# Patient Record
Sex: Male | Born: 1945
Health system: Southern US, Community
[De-identification: ages and names within clinical notes are randomized; demographics above are authoritative.]

## PROBLEM LIST (undated history)

## (undated) DIAGNOSIS — E785 Hyperlipidemia, unspecified: Secondary | ICD-10-CM

## (undated) DIAGNOSIS — R413 Other amnesia: Secondary | ICD-10-CM

## (undated) DIAGNOSIS — R7303 Prediabetes: Secondary | ICD-10-CM

## (undated) DIAGNOSIS — T7840XA Allergy, unspecified, initial encounter: Secondary | ICD-10-CM

## (undated) HISTORY — DX: Hyperlipidemia, unspecified: E78.5

## (undated) HISTORY — PX: FINGER SURGERY: SHX640

## (undated) HISTORY — DX: Prediabetes: R73.03

## (undated) HISTORY — DX: Other amnesia: R41.3

## (undated) HISTORY — DX: Allergy, unspecified, initial encounter: T78.40XA

---

## 2015-06-21 ENCOUNTER — Telehealth: Payer: Self-pay

## 2015-06-21 ENCOUNTER — Ambulatory Visit (INDEPENDENT_AMBULATORY_CARE_PROVIDER_SITE_OTHER): Payer: Medicare Other | Admitting: Internal Medicine

## 2015-06-21 VITALS — BP 120/74 | HR 60 | Temp 97.7°F | Resp 18 | Ht 72.0 in | Wt 196.0 lb

## 2015-06-21 DIAGNOSIS — L299 Pruritus, unspecified: Secondary | ICD-10-CM | POA: Diagnosis not present

## 2015-06-21 MED ORDER — CETIRIZINE HCL 10 MG PO TABS: 10.0000 mg | ORAL_TABLET | Freq: Every day | ORAL | Status: DC

## 2015-06-21 MED ORDER — TRIAMCINOLONE 0.1 % CREAM:EUCERIN CREAM 1:1: 1.0000 "application " | TOPICAL_CREAM | Freq: Three times a day (TID) | CUTANEOUS | Status: DC

## 2015-06-21 NOTE — Progress Notes (Signed)
   Subjective:  This chart was scribed for [name], MD by Eric Tyler, Medical Scribe. This patient was seen in Room 3 and the patient's care was started at 10:15 AM.   Patient ID: Eric Tyler, male    DOB: 03-26-1946, 69 y.o.   MRN: [id number]  HPI HPI Comments: Eric Tyler is a 69 y.o. male who presents to [facility] complaining of itching over the back and neck area, gradual onset one day ago. He has noted no skin lesions. Pt notes that the itching is more severe over his neck area. He reports associated symptoms of dryness and a heat sensation to the area. Pt states that it is affecting his sleep. Pt reports that he did not start any new food, or change in skin products; he did mow the grass while wearing a long sleeve shirt yesterday morning. Pt reports that he took Benadryl for the symptoms; however he found no relief with it. Pt denies any symptoms of discomfort, hives, diarrhea, nausea, shortness of breath, rhinorrhea, sore throat.     He reports good health with no chronic illnesses and no medications  Review of Systems  HENT: Negative for rhinorrhea.   Respiratory: Negative for shortness of breath.   Gastrointestinal: Negative for nausea and diarrhea.  Skin: Negative for color change and rash.       itching      Objective:   Physical Exam  Constitutional: He is oriented to person, place, and time. He appears well-developed and well-nourished. No distress.  HENT:  Head: Normocephalic and atraumatic.  Right Ear: External ear normal.  Left Ear: External ear normal.  Nose: Nose normal.  Mouth/Throat: Oropharynx is clear and moist.  Eyes: Conjunctivae and EOM are normal. Pupils are equal, round, and reactive to light.  Neck: Neck supple. No thyromegaly present.  Cardiovascular: Normal rate.   Pulmonary/Chest: Effort normal.  Lymphadenopathy:    He has no cervical adenopathy.  Neurological: He is alert and oriented to person, place, and  time. No cranial nerve deficit.  Skin: Skin is warm and dry.  He has no skin changes in the areas where he is itching. He has dry skin in general but not to the point of roughness or ichthyosis.  Psychiatric: He has a normal mood and affect. His behavior is normal.  Nursing note and vitals reviewed.     Assessment & Plan:  I have completed the patient encounter in its entirety as documented by the scribe, with editing by me where necessary. Robert P. Laney Pastor, M.D.  Pruritis of unknown etiology Meds ordered this encounter  Medications  . Triamcinolone Acetonide (TRIAMCINOLONE 0.1 % CREAM : EUCERIN) CREA    Sig: Apply 1 application topically 3 (three) times daily. 50:50 mix---apply 5-7 days tid    Dispense:  1 each    Refill:  0  . cetirizine (ZYRTEC) 10 MG tablet    Sig: Take 1 tablet (10 mg total) by mouth daily.    Dispense:  7 tablet    Refill:  0    May prefer otc if less expensive   If he develops progression of this itching then referred underlying pathology will be initiated

## 2015-06-21 NOTE — Telephone Encounter (Signed)
Called in Triamcinolone acetonide cream pre request of Dr. Laney Pastor because his prescription printed instead of going to the pharmacy. The patient left before we noticed that it was printed. It was called in to [facility] on [address]. Tried to reach out to patient about prescription but was unable to get through on both numbers that were listed.

## 2015-06-22 ENCOUNTER — Encounter: Payer: Self-pay | Admitting: Internal Medicine

## 2015-07-29 DIAGNOSIS — Z0000 Encounter for general adult medical examination without abnormal findings: Secondary | ICD-10-CM | POA: Diagnosis not present

## 2015-07-29 DIAGNOSIS — Z23 Encounter for immunization: Secondary | ICD-10-CM | POA: Diagnosis not present

## 2015-07-29 DIAGNOSIS — Z1212 Encounter for screening for malignant neoplasm of rectum: Secondary | ICD-10-CM | POA: Diagnosis not present

## 2015-09-02 DIAGNOSIS — Z0000 Encounter for general adult medical examination without abnormal findings: Secondary | ICD-10-CM | POA: Diagnosis not present

## 2015-09-02 DIAGNOSIS — Z0001 Encounter for general adult medical examination with abnormal findings: Secondary | ICD-10-CM | POA: Diagnosis not present

## 2015-09-02 DIAGNOSIS — Z13220 Encounter for screening for lipoid disorders: Secondary | ICD-10-CM | POA: Diagnosis not present

## 2015-09-02 DIAGNOSIS — Z125 Encounter for screening for malignant neoplasm of prostate: Secondary | ICD-10-CM | POA: Diagnosis not present

## 2015-09-02 DIAGNOSIS — L608 Other nail disorders: Secondary | ICD-10-CM | POA: Diagnosis not present

## 2015-10-18 ENCOUNTER — Emergency Department (HOSPITAL_COMMUNITY): Payer: Medicare Other

## 2015-10-18 ENCOUNTER — Emergency Department (HOSPITAL_COMMUNITY)
Admission: EM | Admit: 2015-10-18 | Discharge: 2015-10-18 | Disposition: A | Discharge status: Home / Self Care | Payer: Medicare Other | Attending: Emergency Medicine | Admitting: Emergency Medicine

## 2015-10-18 ENCOUNTER — Encounter (HOSPITAL_COMMUNITY): Payer: Self-pay

## 2015-10-18 DIAGNOSIS — Z79899 Other long term (current) drug therapy: Secondary | ICD-10-CM | POA: Diagnosis not present

## 2015-10-18 DIAGNOSIS — R109 Unspecified abdominal pain: Secondary | ICD-10-CM | POA: Diagnosis not present

## 2015-10-18 DIAGNOSIS — N201 Calculus of ureter: Secondary | ICD-10-CM

## 2015-10-18 DIAGNOSIS — R197 Diarrhea, unspecified: Secondary | ICD-10-CM | POA: Diagnosis not present

## 2015-10-18 DIAGNOSIS — R1110 Vomiting, unspecified: Secondary | ICD-10-CM | POA: Diagnosis not present

## 2015-10-18 LAB — COMPREHENSIVE METABOLIC PANEL
ALK PHOS: 55 U/L (ref 38–126)
ALT: 28 U/L (ref 17–63)
ANION GAP: 9 (ref 5–15)
AST: 30 U/L (ref 15–41)
Albumin: 4.4 g/dL (ref 3.5–5.0)
BILIRUBIN TOTAL: 0.9 mg/dL (ref 0.3–1.2)
BUN: 14 mg/dL (ref 6–20)
CALCIUM: 9.6 mg/dL (ref 8.9–10.3)
CO2: 27 mmol/L (ref 22–32)
Chloride: 105 mmol/L (ref 101–111)
Creatinine, Ser: 1.19 mg/dL (ref 0.61–1.24)
GFR calc Af Amer: >60 mL/min (ref >60)
GLUCOSE: 123 mg/dL — AB (ref 65–99)
POTASSIUM: 4.2 mmol/L (ref 3.5–5.1)
Sodium: 141 mmol/L (ref 135–145)
TOTAL PROTEIN: 7.5 g/dL (ref 6.5–8.1)

## 2015-10-18 LAB — URINALYSIS, ROUTINE W REFLEX MICROSCOPIC
BILIRUBIN URINE: NEGATIVE
Glucose, UA: NEGATIVE mg/dL
KETONES UR: NEGATIVE mg/dL
LEUKOCYTES UA: NEGATIVE
NITRITE: NEGATIVE
PH: 6 (ref 5.0–8.0)
PROTEIN: NEGATIVE mg/dL
Specific Gravity, Urine: 1.013 (ref 1.005–1.030)

## 2015-10-18 LAB — CBC
HEMATOCRIT: 42.8 % (ref 39.0–52.0)
HEMOGLOBIN: 14 g/dL (ref 13.0–17.0)
MCH: 31.3 pg (ref 26.0–34.0)
MCHC: 32.7 g/dL (ref 30.0–36.0)
MCV: 95.5 fL (ref 78.0–100.0)
Platelets: 288 10*3/uL (ref 150–400)
RBC: 4.48 MIL/uL (ref 4.22–5.81)
RDW: 13.1 % (ref 11.5–15.5)
WBC: 3.1 10*3/uL — AB (ref 4.0–10.5)

## 2015-10-18 LAB — URINE MICROSCOPIC-ADD ON

## 2015-10-18 LAB — LIPASE, BLOOD: Lipase: 30 U/L (ref 11–51)

## 2015-10-18 MED ORDER — SODIUM CHLORIDE 0.9 % IV SOLN
Freq: Once | INTRAVENOUS | Status: AC
  Administered 2015-10-18: 07:00:00 via INTRAVENOUS

## 2015-10-18 MED ORDER — ONDANSETRON HCL 4 MG/2ML IJ SOLN
4.0000 mg | Freq: Once | INTRAMUSCULAR | Status: AC | PRN
  Administered 2015-10-18: 4 mg via INTRAVENOUS
  Filled 2015-10-18 (×2): qty 2

## 2015-10-18 MED ORDER — ONDANSETRON 8 MG PO TBDP: 8.0000 mg | ORAL_TABLET | Freq: Three times a day (TID) | ORAL | Status: DC | PRN

## 2015-10-18 MED ORDER — IOHEXOL 300 MG/ML  SOLN
100.0000 mL | Freq: Once | INTRAMUSCULAR | Status: AC | PRN
  Administered 2015-10-18: 100 mL via INTRAVENOUS

## 2015-10-18 MED ORDER — IOHEXOL 300 MG/ML  SOLN
50.0000 mL | Freq: Once | INTRAMUSCULAR | Status: AC | PRN
  Administered 2015-10-18: 50 mL via ORAL

## 2015-10-18 MED ORDER — HYDROMORPHONE HCL 1 MG/ML IJ SOLN
1.0000 mg | INTRAMUSCULAR | Status: DC | PRN
  Administered 2015-10-18: 1 mg via INTRAVENOUS
  Filled 2015-10-18: qty 1

## 2015-10-18 MED ORDER — ONDANSETRON HCL 4 MG/2ML IJ SOLN
4.0000 mg | Freq: Once | INTRAMUSCULAR | Status: AC
  Administered 2015-10-18: 4 mg via INTRAVENOUS
  Filled 2015-10-18: qty 2

## 2015-10-18 MED ORDER — HYDROCODONE-ACETAMINOPHEN 5-325 MG PO TABS: 1.0000 | ORAL_TABLET | ORAL | Status: DC | PRN

## 2015-10-18 MED ORDER — SODIUM CHLORIDE 0.9 % IV SOLN
1000.0000 mL | INTRAVENOUS | Status: DC
  Administered 2015-10-18: 1000 mL via INTRAVENOUS

## 2015-10-18 NOTE — ED Notes (Signed)
Pt started vomiting about 5am, no diarrhea

## 2015-10-18 NOTE — ED Notes (Signed)
Pt given a urinal and informed he needs to give a urine sample

## 2015-10-18 NOTE — ED Provider Notes (Signed)
CSN: [id number]     Arrival date & time 10/18/15  0547 History   First MD Initiated Contact with Patient 10/18/15 [phone]     Chief Complaint  Patient presents with  . Emesis    Patient is a 69 y.o. male presenting with vomiting. The history is provided by the patient.  Emesis Duration:  2 hours Timing:  Constant Number of daily episodes:  20 Quality:  Undigested food Chronicity:  New Relieved by:  Nothing Associated symptoms: abdominal pain   Associated symptoms: no diarrhea and no fever   Abdominal pain:    Pain location: mid right abdomen towards the side.   Quality:  Sharp   Severity:  Severe   Onset quality:  Sudden   Duration:  2 hours   Timing:  Intermittent   Chronicity:  New Risk factors: no prior abdominal surgery, no suspect food intake and no travel to endemic areas     Past Medical History  Diagnosis Date  . Allergy    Past Surgical History  Procedure Laterality Date  . Finger surgery     Family History  Problem Relation Age of Onset  . Hypertension Mother   . Hypertension Father   . Hyperlipidemia Father    Social History  Substance Use Topics  . Smoking status: Never Smoker   . Smokeless tobacco: None  . Alcohol Use: No    Review of Systems  Gastrointestinal: Positive for vomiting and abdominal pain. Negative for diarrhea.  All other systems reviewed and are negative.     Allergies  Review of patient's allergies indicates no known allergies.  Home Medications   Prior to Admission medications   Medication Sig Start Date End Date Taking? Authorizing Provider  Multiple Vitamin (MULTIVITAMIN WITH MINERALS) TABS tablet Take 1 tablet by mouth daily.   Yes Historical Provider, MD  omega-3 acid ethyl esters (LOVAZA) 1 G capsule Take 1 g by mouth 2 (two) times daily.   Yes Historical Provider, MD  cetirizine (ZYRTEC) 10 MG tablet Take 1 tablet (10 mg total) by mouth daily. Patient not taking: Reported on 10/18/2015 06/21/15   Leandrew Koyanagi, MD   HYDROcodone-acetaminophen (NORCO/VICODIN) 5-325 MG tablet Take 1 tablet by mouth every 4 (four) hours as needed. 10/18/15   Dorie Rank, MD  ondansetron (ZOFRAN ODT) 8 MG disintegrating tablet Take 1 tablet (8 mg total) by mouth every 8 (eight) hours as needed for nausea or vomiting. 10/18/15   Dorie Rank, MD  Triamcinolone Acetonide (TRIAMCINOLONE 0.1 % CREAM : EUCERIN) CREA Apply 1 application topically 3 (three) times daily. 50:50 mix---apply 5-7 days tid Patient not taking: Reported on 10/18/2015 06/21/15   Leandrew Koyanagi, MD   BP 126/78 mmHg  Pulse 61  Temp(Src) 97.3 F (36.3 C) (Axillary)  Resp 16  SpO2 92% Physical Exam  Constitutional: He appears well-developed and well-nourished. No distress.  HENT:  Head: Normocephalic and atraumatic.  Right Ear: External ear normal.  Left Ear: External ear normal.  Eyes: Conjunctivae are normal. Right eye exhibits no discharge. Left eye exhibits no discharge. No scleral icterus.  Neck: Neck supple. No tracheal deviation present.  Cardiovascular: Normal rate, regular rhythm and intact distal pulses.   Pulmonary/Chest: Effort normal and breath sounds normal. No stridor. No respiratory distress. He has no wheezes. He has no rales.  Abdominal: Soft. Bowel sounds are normal. He exhibits no distension, no abdominal bruit, no ascites, no pulsatile midline mass and no mass. There is tenderness in the right upper quadrant  and right lower quadrant. There is no rigidity, no rebound and no guarding. No hernia.  Musculoskeletal: He exhibits no edema or tenderness.  Neurological: He is alert. He has normal strength. No cranial nerve deficit (no facial droop, extraocular movements intact, no slurred speech) or sensory deficit. He exhibits normal muscle tone. He displays no seizure activity. Coordination normal.  Skin: Skin is warm and dry. No rash noted.  Psychiatric: He has a normal mood and affect.  Nursing note and vitals reviewed.   ED Course   Procedures (including critical care time) Labs Review Labs Reviewed  COMPREHENSIVE METABOLIC PANEL - Abnormal; Notable for the following:    Glucose, Bld 123 (*)    All other components within normal limits  CBC - Abnormal; Notable for the following:    WBC 3.1 (*)    All other components within normal limits  URINALYSIS, ROUTINE W REFLEX MICROSCOPIC (NOT AT [facility]) - Abnormal; Notable for the following:    Hgb urine dipstick TRACE (*)    All other components within normal limits  URINE MICROSCOPIC-ADD ON - Abnormal; Notable for the following:    Squamous Epithelial / LPF 0-5 (*)    Bacteria, UA RARE (*)    All other components within normal limits  LIPASE, BLOOD    Imaging Review Ct Abdomen Pelvis W Contrast  10/18/2015  CLINICAL DATA:  Pt started vomiting about 5am, no diarrhea. Right-sided abdominal pain and vomiting also reported. No other HX to report EXAM: CT ABDOMEN AND PELVIS WITH CONTRAST TECHNIQUE: Multidetector CT imaging of the abdomen and pelvis was performed using the standard protocol following bolus administration of intravenous contrast. CONTRAST:  64mL OMNIPAQUE IOHEXOL 300 MG/ML SOLN, 173mL OMNIPAQUE IOHEXOL 300 MG/ML SOLN COMPARISON:  None. FINDINGS: Lung bases: Minor dependent subsegmental atelectasis. Otherwise clear. Heart mildly enlarged. Liver, spleen, gallbladder, pancreas, adrenal glands:  Normal. Kidneys, ureters, bladder: 4 mm low-density lesion arises from the posterior midpole the left kidney, most likely a cyst. No other renal masses or lesions. No renal stones. No hydronephrosis. Small calculus lies at, or just adjacent to, the right ureterovesicular junction. This may be outside of the ureter and bladder. However, given the symptoms of right-sided abdominal pain and vomiting, a ureteral stone without hydronephrosis should be considered likely. No other evidence of a ureteral stone. Bladder is unremarkable. Prostate gland:  Mildly enlarged. Lymph nodes:  No  adenopathy. Ascites:  None. Gastrointestinal: Numerous colonic diverticula mostly along the sigmoid colon. No diverticulitis. Colon otherwise unremarkable. Stomach and small bowel are unremarkable. Normal appendix is visualized. Musculoskeletal: Arthropathic changes of the hips, right greater than left. Mild degenerative changes of the lumbar spine. No osteoblastic or osteolytic lesions. IMPRESSION: 1. Possible small stone, approximate 2 mm in size, at the right ureterovesicular junction versus a directly adjacent phlebolith. No hydronephrosis. 2. No other evidence of an acute abnormality. 3. Numerous colonic diverticula without diverticulitis. 4. Normal appendix visualized. Electronically Signed   By: Lajean Manes M.D.   On: 10/18/2015 11:40   I have personally reviewed and evaluated these images and lab results as part of my medical decision-making.  Medications  0.9 %  sodium chloride infusion (1,000 mLs Intravenous New Bag/Given 10/18/15 0735)  HYDROmorphone (DILAUDID) injection 1 mg (1 mg Intravenous Given 10/18/15 0730)  ondansetron (ZOFRAN) injection 4 mg (4 mg Intravenous Given 10/18/15 0642)  0.9 %  sodium chloride infusion ( Intravenous Stopped 10/18/15 0742)  ondansetron (ZOFRAN) injection 4 mg (4 mg Intravenous Given 10/18/15 0729)  iohexol (OMNIPAQUE) 300  MG/ML solution 50 mL (50 mLs Oral Contrast Given 10/18/15 0900)  iohexol (OMNIPAQUE) 300 MG/ML solution 100 mL (100 mLs Intravenous Contrast Given 10/18/15 1115)     MDM   Final diagnoses:  Ureteral stone   I suspect the patient's symptoms are related to ureteral stone. CT scan is not definitive because there is no evidence of hydronephrosis or hydroureter however clinically his symptoms correlate with ureteral colic. Patient's laboratory tests are otherwise unremarkable. He has no other abnormality noted on the CT scan to account for his pain.  The patient's symptoms improved with treatment in the emergency department. Plan for  outpatient follow-up with primary doctor or urologist. Use a urine strainer. I will discharge home with a prescription for hydrocodone and Zofran.    Dorie Rank, MD 10/18/15 1210

## 2015-10-18 NOTE — Discharge Instructions (Signed)
Please use a urine strainer as we discussed.  Follow up with urology or primary care doctor in 1 week.   Kidney Stones Kidney stones (urolithiasis) are deposits that form inside your kidneys. The intense pain is caused by the stone moving through the urinary tract. When the stone moves, the ureter goes into spasm around the stone. The stone is usually passed in the urine.  CAUSES   A disorder that makes certain neck glands produce too much parathyroid hormone (primary hyperparathyroidism).  A buildup of uric acid crystals, similar to gout in your joints.  Narrowing (stricture) of the ureter.  A kidney obstruction present at birth (congenital obstruction).  Previous surgery on the kidney or ureters.  Numerous kidney infections. SYMPTOMS   Feeling sick to your stomach (nauseous).  Throwing up (vomiting).  Blood in the urine (hematuria).  Pain that usually spreads (radiates) to the groin.  Frequency or urgency of urination. DIAGNOSIS   Taking a history and physical exam.  Blood or urine tests.  CT scan.  Occasionally, an examination of the inside of the urinary bladder (cystoscopy) is performed. TREATMENT   Observation.  Increasing your fluid intake.  Extracorporeal shock wave lithotripsy--This is a noninvasive procedure that uses shock waves to break up kidney stones.  Surgery may be needed if you have severe pain or persistent obstruction. There are various surgical procedures. Most of the procedures are performed with the use of small instruments. Only small incisions are needed to accommodate these instruments, so recovery time is minimized. The size, location, and chemical composition are all important variables that will determine the proper choice of action for you. Talk to your health care provider to better understand your situation so that you will minimize the risk of injury to yourself and your kidney.  HOME CARE INSTRUCTIONS   Drink enough water and fluids  to keep your urine clear or pale yellow. This will help you to pass the stone or stone fragments.  Strain all urine through the provided strainer. Keep all particulate matter and stones for your health care provider to see. The stone causing the pain may be as small as a grain of salt. It is very important to use the strainer each and every time you pass your urine. The collection of your stone will allow your health care provider to analyze it and verify that a stone has actually passed. The stone analysis will often identify what you can do to reduce the incidence of recurrences.  Only take over-the-counter or prescription medicines for pain, discomfort, or fever as directed by your health care provider.  Keep all follow-up visits as told by your health care provider. This is important.  Get follow-up X-rays if required. The absence of pain does not always mean that the stone has passed. It may have only stopped moving. If the urine remains completely obstructed, it can cause loss of kidney function or even complete destruction of the kidney. It is your responsibility to make sure X-rays and follow-ups are completed. Ultrasounds of the kidney can show blockages and the status of the kidney. Ultrasounds are not associated with any radiation and can be performed easily in a matter of minutes.  Make changes to your daily diet as told by your health care provider. You may be told to:  Limit the amount of salt that you eat.  Eat 5 or more servings of fruits and vegetables each day.  Limit the amount of meat, poultry, fish, and eggs that you eat.  Collect a 24-hour urine sample as told by your health care provider.You may need to collect another urine sample every 6-12 months. SEEK MEDICAL CARE IF:  You experience pain that is progressive and unresponsive to any pain medicine you have been prescribed. SEEK IMMEDIATE MEDICAL CARE IF:   Pain cannot be controlled with the prescribed  medicine.  You have a fever or shaking chills.  The severity or intensity of pain increases over 18 hours and is not relieved by pain medicine.  You develop a new onset of abdominal pain.  You feel faint or pass out.  You are unable to urinate.   This information is not intended to replace advice given to you by your health care provider. Make sure you discuss any questions you have with your health care provider.   Document Released: 11/16/2005 Document Revised: 08/07/2015 Document Reviewed: 04/19/2013 Elsevier Interactive Patient Education [employer].

## 2015-11-07 DIAGNOSIS — N201 Calculus of ureter: Secondary | ICD-10-CM | POA: Diagnosis not present

## 2016-05-18 ENCOUNTER — Emergency Department (HOSPITAL_COMMUNITY)
Admission: EM | Admit: 2016-05-18 | Discharge: 2016-05-18 | Disposition: A | Discharge status: Home / Self Care | Payer: Medicare Other | Attending: Emergency Medicine | Admitting: Emergency Medicine

## 2016-05-18 ENCOUNTER — Emergency Department (HOSPITAL_COMMUNITY): Payer: Medicare Other

## 2016-05-18 ENCOUNTER — Encounter (HOSPITAL_COMMUNITY): Payer: Self-pay | Admitting: *Deleted

## 2016-05-18 DIAGNOSIS — M1711 Unilateral primary osteoarthritis, right knee: Secondary | ICD-10-CM | POA: Insufficient documentation

## 2016-05-18 DIAGNOSIS — Z79899 Other long term (current) drug therapy: Secondary | ICD-10-CM | POA: Insufficient documentation

## 2016-05-18 DIAGNOSIS — M179 Osteoarthritis of knee, unspecified: Secondary | ICD-10-CM | POA: Diagnosis not present

## 2016-05-18 DIAGNOSIS — M25561 Pain in right knee: Secondary | ICD-10-CM | POA: Diagnosis not present

## 2016-05-18 NOTE — ED Provider Notes (Signed)
CSN: [id number]     Arrival date & time 05/18/16  1905 History  By signing my name below, I, Eric Tyler, attest that this documentation has been prepared under the direction and in the presence of Gloriann Loan, PA-C.  Electronically Signed: Tedra Coupe. Sheppard Coil, ED Scribe. 05/18/2016. 9:40 PM.   Chief Complaint  Patient presents with  . Knee Pain    The history is provided by the patient and the spouse. No language interpreter was used.   HPI Comments: Eric Tyler is a 70 y.o. male who presents to the Emergency Department complaining of gradual onset, constant, radiating right knee pain to right foot x 1 month. Pt reports he was driving when pain initially began. Pt has not taken any medications for pain. Per pt's wife, she has applied ice and heat to pt's knee with temporary relief. Pain is exacerbated upon movement. Pt has never had any surgery on his knee. Denies known injury/trauma.  Denies any fever, chills, swelling of the knee, color change, numbness, and weakness.  He is ambulatory.   Past Medical History  Diagnosis Date  . Allergy    Past Surgical History  Procedure Laterality Date  . Finger surgery     Family History  Problem Relation Age of Onset  . Hypertension Mother   . Hypertension Father   . Hyperlipidemia Father    Social History  Substance Use Topics  . Smoking status: Never Smoker   . Smokeless tobacco: None  . Alcohol Use: No    Review of Systems  Constitutional: Negative for fever and chills.  Musculoskeletal: Positive for myalgias and arthralgias. Negative for joint swelling.  Skin: Negative for color change.  Neurological: Negative for weakness and numbness.  All other systems reviewed and are negative.  Allergies  Review of patient's allergies indicates no known allergies.  Home Medications   Prior to Admission medications   Medication Sig Start Date End Date Taking? Authorizing Provider  cetirizine (ZYRTEC) 10 MG tablet Take 1 tablet  (10 mg total) by mouth daily. Patient not taking: Reported on 10/18/2015 06/21/15   Leandrew Koyanagi, MD  HYDROcodone-acetaminophen (NORCO/VICODIN) 5-325 MG tablet Take 1 tablet by mouth every 4 (four) hours as needed. 10/18/15   Dorie Rank, MD  Multiple Vitamin (MULTIVITAMIN WITH MINERALS) TABS tablet Take 1 tablet by mouth daily.    Historical Provider, MD  omega-3 acid ethyl esters (LOVAZA) 1 G capsule Take 1 g by mouth 2 (two) times daily.    Historical Provider, MD  ondansetron (ZOFRAN ODT) 8 MG disintegrating tablet Take 1 tablet (8 mg total) by mouth every 8 (eight) hours as needed for nausea or vomiting. 10/18/15   Dorie Rank, MD  Triamcinolone Acetonide (TRIAMCINOLONE 0.1 % CREAM : EUCERIN) CREA Apply 1 application topically 3 (three) times daily. 50:50 mix---apply 5-7 days tid Patient not taking: Reported on 10/18/2015 06/21/15   Leandrew Koyanagi, MD   BP 117/90 mmHg  Pulse 84  Temp(Src) 98.3 F (36.8 C) (Oral)  Resp 18  Ht 6\' 1"  (1.854 m)  Wt 90.719 kg  BMI 26.39 kg/m2  SpO2 97% Physical Exam  Constitutional: He is oriented to person, place, and time. He appears well-developed and well-nourished.  Non-toxic appearance. He does not have a sickly appearance. He does not appear ill.  HENT:  Head: Normocephalic and atraumatic.  Mouth/Throat: Oropharynx is clear and moist.  Eyes: Conjunctivae are normal. Pupils are equal, round, and reactive to light.  Neck: Normal range of motion.  Neck supple.  Cardiovascular: Normal rate and regular rhythm.   Pulses:      Dorsalis pedis pulses are 2+ on the right side, and 2+ on the left side.  Pulmonary/Chest: Effort normal and breath sounds normal. No accessory muscle usage or stridor. No respiratory distress. He has no wheezes. He has no rhonchi. He has no rales.  Abdominal: Soft. Bowel sounds are normal. He exhibits no distension. There is no tenderness.  Musculoskeletal: Normal range of motion.  Right knee: No obvious deformity, swelling,  warmth, or erythema.  TTP along medial patella.  No quadriceps or patellar tendon tenderness.  No lateral joint line tenderness.  No ligamentous laxity.  FPROM and FAROM.  Lymphadenopathy:    He has no cervical adenopathy.  Neurological: He is alert and oriented to person, place, and time.  5/5 strength of hips, knees, and ankles bilaterally. Sensation intact to light touch BLE.  Skin: Skin is warm and dry.  Psychiatric: He has a normal mood and affect. His behavior is normal.    ED Course  Procedures (including critical care time) DIAGNOSTIC STUDIES: Oxygen Saturation is 97% on RA, normal by my interpretation.    COORDINATION OF CARE: 9:39 PM-Discussed treatment plan which includes X-ray of right knee with pt at bedside and pt agreed to plan.   Imaging Review Dg Knee Complete 4 Views Right  05/18/2016  CLINICAL DATA:  70 year old male with right knee pain. EXAM: RIGHT KNEE - COMPLETE 4+ VIEW COMPARISON:  None. FINDINGS: There is no acute fracture or dislocation. The bones are osteopenic. There is mild osteoarthritic changes of the knee with narrowing of the medial and lateral compartment and bone spurring. There is no joint effusion. Soft tissues appear unremarkable. IMPRESSION: No acute fracture or dislocation. Electronically Signed   By: Anner Crete M.D.   On: 05/18/2016 22:22   I have personally reviewed and evaluated these images and lab results as part of my medical decision-making.   MDM   Final diagnoses:  Right knee pain  Osteoarthritis of right knee, unspecified osteoarthritis type   Patient presents with atraumatic right knee pain.  No swelling, erythema, numbness, or weakness.  VSS, NAD.  On exam, neurovascularly intact.  Good pulses.  No ligamentous laxity.  Ambulatory.  Plain films show no acute changes, there are some mild osteoarthritic changes and bone spurring.  Doubt venous/arterial thrombosis or other emergent etiology.  Doubt septic arthritis.  Recommend  Tylenol for pain.  Follow up PCP.  Discussed return precautions.  Patient agrees and acknowledges the above plan for discharge.   I personally performed the services described in this documentation, which was scribed in my presence. The recorded information has been reviewed and is accurate.        Gloriann Loan, PA-C 05/18/16 [place], MD 05/18/16 [phone]

## 2016-05-18 NOTE — ED Notes (Signed)
Patient verbalized understanding of discharge instructions and denies any further needs or questions at this time. VS stable. Patient ambulatory with steady gait, declined wheelchair - RN escorted patient and his wife to ED lobby.

## 2016-05-18 NOTE — ED Notes (Signed)
Patient transported to x-ray. ?

## 2016-05-18 NOTE — Discharge Instructions (Signed)

## 2016-05-18 NOTE — ED Notes (Signed)
Pt c/o right knee pain x 1 month. Denies injury.

## 2016-05-18 NOTE — ED Notes (Signed)
PA-C to see and assess patient before RN assessment. See PA note. 

## 2016-07-28 DIAGNOSIS — Z23 Encounter for immunization: Secondary | ICD-10-CM | POA: Diagnosis not present

## 2016-07-28 DIAGNOSIS — Z1212 Encounter for screening for malignant neoplasm of rectum: Secondary | ICD-10-CM | POA: Diagnosis not present

## 2016-07-28 DIAGNOSIS — Z0000 Encounter for general adult medical examination without abnormal findings: Secondary | ICD-10-CM | POA: Diagnosis not present

## 2016-07-28 DIAGNOSIS — Z13220 Encounter for screening for lipoid disorders: Secondary | ICD-10-CM | POA: Diagnosis not present

## 2016-07-28 DIAGNOSIS — Z0001 Encounter for general adult medical examination with abnormal findings: Secondary | ICD-10-CM | POA: Diagnosis not present

## 2016-08-05 DIAGNOSIS — Z1212 Encounter for screening for malignant neoplasm of rectum: Secondary | ICD-10-CM | POA: Diagnosis not present

## 2016-08-05 DIAGNOSIS — Z0001 Encounter for general adult medical examination with abnormal findings: Secondary | ICD-10-CM | POA: Diagnosis not present

## 2017-03-12 DIAGNOSIS — L299 Pruritus, unspecified: Secondary | ICD-10-CM | POA: Diagnosis not present

## 2017-08-12 DIAGNOSIS — Z23 Encounter for immunization: Secondary | ICD-10-CM | POA: Diagnosis not present

## 2017-08-12 DIAGNOSIS — Z0000 Encounter for general adult medical examination without abnormal findings: Secondary | ICD-10-CM | POA: Diagnosis not present

## 2017-08-12 DIAGNOSIS — R7303 Prediabetes: Secondary | ICD-10-CM | POA: Diagnosis not present

## 2017-08-12 DIAGNOSIS — Z125 Encounter for screening for malignant neoplasm of prostate: Secondary | ICD-10-CM | POA: Diagnosis not present

## 2017-08-16 DIAGNOSIS — D72819 Decreased white blood cell count, unspecified: Secondary | ICD-10-CM | POA: Diagnosis not present

## 2017-08-16 DIAGNOSIS — R0989 Other specified symptoms and signs involving the circulatory and respiratory systems: Secondary | ICD-10-CM | POA: Diagnosis not present

## 2017-08-16 DIAGNOSIS — Q800 Ichthyosis vulgaris: Secondary | ICD-10-CM | POA: Diagnosis not present

## 2017-08-16 DIAGNOSIS — Z23 Encounter for immunization: Secondary | ICD-10-CM | POA: Diagnosis not present

## 2017-08-16 DIAGNOSIS — H9193 Unspecified hearing loss, bilateral: Secondary | ICD-10-CM | POA: Diagnosis not present

## 2017-08-16 DIAGNOSIS — Z0000 Encounter for general adult medical examination without abnormal findings: Secondary | ICD-10-CM | POA: Diagnosis not present

## 2017-08-16 DIAGNOSIS — R7303 Prediabetes: Secondary | ICD-10-CM | POA: Diagnosis not present

## 2017-08-16 DIAGNOSIS — Z6827 Body mass index (BMI) 27.0-27.9, adult: Secondary | ICD-10-CM | POA: Diagnosis not present

## 2017-08-16 DIAGNOSIS — H18413 Arcus senilis, bilateral: Secondary | ICD-10-CM | POA: Diagnosis not present

## 2017-08-19 ENCOUNTER — Encounter: Payer: Self-pay | Admitting: Oncology

## 2017-08-19 ENCOUNTER — Telehealth: Payer: Self-pay | Admitting: Oncology

## 2017-08-19 NOTE — Telephone Encounter (Signed)
Appt has been scheduled for the pt to see Dr. Alen Blew on 10/16 at 11am. Letter mailed to the pt and faxed to the referring to notify the pt.

## 2017-09-14 ENCOUNTER — Encounter: Payer: Medicare Other | Admitting: Oncology

## 2017-09-15 ENCOUNTER — Encounter: Payer: Medicare Other | Admitting: Oncology

## 2017-09-24 ENCOUNTER — Ambulatory Visit (HOSPITAL_BASED_OUTPATIENT_CLINIC_OR_DEPARTMENT_OTHER): Payer: PPO | Admitting: Oncology

## 2017-09-24 ENCOUNTER — Telehealth: Payer: Self-pay | Admitting: Oncology

## 2017-09-24 VITALS — BP 124/73 | HR 71 | Temp 97.4°F | Resp 17 | Ht 73.0 in | Wt 203.9 lb

## 2017-09-24 DIAGNOSIS — D709 Neutropenia, unspecified: Secondary | ICD-10-CM

## 2017-09-24 DIAGNOSIS — D72821 Monocytosis (symptomatic): Secondary | ICD-10-CM

## 2017-09-24 NOTE — Telephone Encounter (Signed)
Scheduled appt per 10/26 los - Gave patient AVS and calender per los.  

## 2017-09-24 NOTE — Progress Notes (Signed)
Reason for Referral: Leukocytopenia  HPI: 71 year old African-American gentleman native of [place] and has relocated here in the last 3 years.  He is in excellent health and shape without any significant comorbid conditions.  He established care with Dr. Shelia Media and had a routine physical in September 2018.  At that time he was noted to have white cell count of 3.5.  His hemoglobin was 12.9, and normal platelets.  He is a neutrophil percentage was 39.3% and his monocyte percentage slightly elevated at 10.2.  Previous CBC obtained and 2016 showed a white cell count of 3.1, hemoglobin of 13.6 and neutrophil percentage of 32.1%.  His monocytes were 12.4%.  He is completely asymptomatic from this finding.  He denies any fevers, chills or weight loss.  He denies any adenopathy or excessive fatigue.  He continues to exercise regularly including running multiple miles a day.  He does not report any headaches, blurred vision, syncope or seizures.  He does not report any fevers, chills or sweats.  He does not report any cough, wheezing or hemoptysis.  He does not report any nausea, vomiting or abdominal pain.  He does not 40 frequency urgency or hesitancy.  He does not report any skeletal complaints of arthralgias or myalgias.  Remainder review of systems unremarkable.  Past Medical History:  Diagnosis Date  . Allergy   :  Past Surgical History:  Procedure Laterality Date  . FINGER SURGERY    :   Current Outpatient Prescriptions:  .  Acetaminophen (TYLENOL 8 HOUR ARTHRITIS PAIN PO), Take 1 tablet by mouth 3 (three) times daily as needed., Disp: , Rfl:  .  Multiple Vitamin (MULTIVITAMIN WITH MINERALS) TABS tablet, Take 1 tablet by mouth daily., Disp: , Rfl:  .  omega-3 acid ethyl esters (LOVAZA) 1 G capsule, Take 1 g by mouth 2 (two) times daily., Disp: , Rfl:  .  Triamcinolone Acetonide (TRIAMCINOLONE 0.1 % CREAM : EUCERIN) CREA, Apply 1 application topically 3 (three) times daily. 50:50  mix---apply 5-7 days tid, Disp: 1 each, Rfl: 0:  No Known Allergies:  Family History  Problem Relation Age of Onset  . Hypertension Mother   . Hypertension Father   . Hyperlipidemia Father   :  Social History   Social History  . Marital status: Married    Spouse name: N/A  . Number of children: N/A  . Years of education: N/A   Occupational History  . Not on file.   Social History Main Topics  . Smoking status: Never Smoker  . Smokeless tobacco: Not on file  . Alcohol use No  . Drug use: No  . Sexual activity: Not on file   Other Topics Concern  . Not on file   Social History Narrative  . No narrative on file  :  Pertinent items are noted in HPI.  Exam: Blood pressure 124/73, pulse 71, temperature (!) 97.4 F (36.3 C), temperature source Oral, resp. rate 17, height 6' 1" (1.854 m), weight 203 lb 14.4 oz (92.5 kg), SpO2 100 %.  ECOG 0 General appearance: alert and cooperative.  Without distress. Throat: No oral thrush or ulcers. Back: negative Resp: clear to auscultation bilaterally Cardio: regular rate and rhythm, S1, S2 normal, no murmur, click, rub or gallop GI: soft, non-tender; bowel sounds normal; no masses,  no organomegaly Extremities: extremities normal, atraumatic, no cyanosis or edema Pulses: 2+ and symmetric Skin: Skin color, texture, turgor normal. No rashes or lesions  CBC    Component Value Date/Time  WBC 3.1 (L) 10/18/2015 0635   RBC 4.48 10/18/2015 0635   HGB 14.0 10/18/2015 0635   HCT 42.8 10/18/2015 0635   PLT 288 10/18/2015 0635   MCV 95.5 10/18/2015 0635   MCH 31.3 10/18/2015 0635   MCHC 32.7 10/18/2015 0635   RDW 13.1 10/18/2015 0635   EXAM: CT ABDOMEN AND PELVIS WITH CONTRAST  TECHNIQUE: Multidetector CT imaging of the abdomen and pelvis was performed using the standard protocol following bolus administration of intravenous contrast.  CONTRAST:  65m OMNIPAQUE IOHEXOL 300 MG/ML SOLN, 1061mOMNIPAQUE IOHEXOL 300 MG/ML  SOLN  COMPARISON:  None.  FINDINGS: Lung bases: Minor dependent subsegmental atelectasis. Otherwise clear. Heart mildly enlarged.  Liver, spleen, gallbladder, pancreas, adrenal glands:  Normal.  Kidneys, ureters, bladder: 4 mm low-density lesion arises from the posterior midpole the left kidney, most likely a cyst. No other renal masses or lesions. No renal stones. No hydronephrosis. Small calculus lies at, or just adjacent to, the right ureterovesicular junction. This may be outside of the ureter and bladder. However, given the symptoms of right-sided abdominal pain and vomiting, a ureteral stone without hydronephrosis should be considered likely. No other evidence of a ureteral stone. Bladder is unremarkable.  Prostate gland:  Mildly enlarged.  Lymph nodes:  No adenopathy.  Ascites:  None.  Gastrointestinal: Numerous colonic diverticula mostly along the sigmoid colon. No diverticulitis. Colon otherwise unremarkable. Stomach and small bowel are unremarkable. Normal appendix is visualized.  Musculoskeletal: Arthropathic changes of the hips, right greater than left. Mild degenerative changes of the lumbar spine. No osteoblastic or osteolytic lesions.  IMPRESSION: 1. Possible small stone, approximate 2 mm in size, at the right ureterovesicular junction versus a directly adjacent phlebolith. No hydronephrosis. 2. No other evidence of an acute abnormality. 3. Numerous colonic diverticula without diverticulitis. 4. Normal appendix visualized.  Assessment and Plan:   [age]ear old gentleman with the following issues:  1.  Leukocytopenia diagnosed in 2016.  His white cell count have fluctuated at that time between 3.1 and 3.5.  His differential is normal except for mild neutropenia and monocytosis actually improved within the last 2 years.  I have no previous CBCs before 2016 since his relocation to this area.  He had a CT scan done in November 2016 for potential  nephrolithiasis which showed no abnormalities.  The differential diagnosis was discussed today with the patient and appears to be related to benign findings.  Ethnic variation versus reactive finding is the most likely etiology.  Other hematological conditions such as myelodysplastic syndrome and lymphoproliferative disorder are considered less likely.  I see no signs or symptoms to suggest an autoimmune disorder.  From a management standpoint, I do not recommend bone marrow biopsy testing at this time.  I do recommend repeating his CBC in 6 months to ensure stability and will review his peripheral smear as well.  If these findings continue to suggest mild leukocytopenia from a benign etiology, no further testing will be needed.  2.  Follow-up: Will be in 6 months to repeat his CBC and give my final recommendation.

## 2017-10-18 DIAGNOSIS — H524 Presbyopia: Secondary | ICD-10-CM | POA: Diagnosis not present

## 2017-10-18 DIAGNOSIS — H2513 Age-related nuclear cataract, bilateral: Secondary | ICD-10-CM | POA: Diagnosis not present

## 2017-10-18 DIAGNOSIS — H04123 Dry eye syndrome of bilateral lacrimal glands: Secondary | ICD-10-CM | POA: Diagnosis not present

## 2017-11-05 ENCOUNTER — Other Ambulatory Visit: Payer: Self-pay

## 2017-11-05 ENCOUNTER — Encounter: Payer: Self-pay | Admitting: Emergency Medicine

## 2017-11-05 ENCOUNTER — Ambulatory Visit (INDEPENDENT_AMBULATORY_CARE_PROVIDER_SITE_OTHER): Payer: PPO | Admitting: Emergency Medicine

## 2017-11-05 VITALS — BP 120/72 | HR 68 | Temp 98.4°F | Resp 16 | Ht 71.0 in | Wt 202.0 lb

## 2017-11-05 DIAGNOSIS — L299 Pruritus, unspecified: Secondary | ICD-10-CM | POA: Diagnosis not present

## 2017-11-05 DIAGNOSIS — M545 Low back pain, unspecified: Secondary | ICD-10-CM

## 2017-11-05 MED ORDER — CETIRIZINE HCL 10 MG PO TABS: 10.0000 mg | ORAL_TABLET | Freq: Every day | ORAL | 3 refills | Status: AC

## 2017-11-05 MED ORDER — TRIAMCINOLONE 0.1 % CREAM:EUCERIN CREAM 1:1: 1.0000 "application " | TOPICAL_CREAM | Freq: Three times a day (TID) | CUTANEOUS | 0 refills | Status: DC

## 2017-11-05 MED ORDER — METHYLPREDNISOLONE ACETATE 80 MG/ML IJ SUSP
80.0000 mg | Freq: Once | INTRAMUSCULAR | Status: AC
Start: 2017-11-05 — End: 2017-11-05
  Administered 2017-11-05: 80 mg via INTRAMUSCULAR

## 2017-11-05 MED ORDER — PREDNISONE 20 MG PO TABS: 40.0000 mg | ORAL_TABLET | Freq: Every day | ORAL | 0 refills | Status: AC

## 2017-11-05 NOTE — Progress Notes (Signed)
Eric Tyler 71 y.o.   Chief Complaint  Patient presents with  . iching    per patient ALL over body x 5 days    HISTORY OF PRESENT ILLNESS: This is a 71 y.o. male complaining of itching all over x 5 days. No other significant symptoms.  HPI   Prior to Admission medications   Medication Sig Start Date End Date Taking? Authorizing Provider  Acetaminophen (TYLENOL 8 HOUR ARTHRITIS PAIN PO) Take 1 tablet by mouth 3 (three) times daily as needed. 09/24/17  Yes [provider]  Multiple Vitamin (MULTIVITAMIN WITH MINERALS) TABS tablet Take 1 tablet by mouth daily.   Yes [provider]  omega-3 acid ethyl esters (LOVAZA) 1 G capsule Take 1 g by mouth 2 (two) times daily.   Yes [provider]  Triamcinolone Acetonide (TRIAMCINOLONE 0.1 % CREAM : EUCERIN) CREA Apply 1 application topically 3 (three) times daily. 50:50 mix---apply 5-7 days tid 06/21/15  Yes Leandrew Koyanagi, MD    No Known Allergies  There are no active problems to display for this patient.   Past Medical History:  Diagnosis Date  . Allergy     Past Surgical History:  Procedure Laterality Date  . FINGER SURGERY      Social History   Socioeconomic History  . Marital status: Married    Spouse name: Not on file  . Number of children: Not on file  . Years of education: Not on file  . Highest education level: Not on file  Social Needs  . Financial resource strain: Not on file  . Food insecurity - worry: Not on file  . Food insecurity - inability: Not on file  . Transportation needs - medical: Not on file  . Transportation needs - non-medical: Not on file  Occupational History  . Not on file  Tobacco Use  . Smoking status: Never Smoker  . Smokeless tobacco: Never Used  Substance and Sexual Activity  . Alcohol use: No    Alcohol/week: 0.0 oz  . Drug use: No  . Sexual activity: Not on file  Other Topics Concern  . Not on file  Social History Narrative  . Not on file     Family History  Problem Relation Age of Onset  . Hypertension Mother   . Hypertension Father   . Hyperlipidemia Father      Review of Systems  Constitutional: Negative.  Negative for chills and fever.  HENT: Negative.  Negative for sore throat.   Eyes: Negative.  Negative for discharge and redness.  Respiratory: Negative.  Negative for cough and shortness of breath.   Cardiovascular: Negative.  Negative for chest pain.  Gastrointestinal: Negative.  Negative for abdominal pain, diarrhea, nausea and vomiting.  Genitourinary: Negative.  Negative for dysuria and hematuria.  Musculoskeletal: Negative.  Negative for myalgias.  Skin: Positive for itching.  Neurological: Negative for dizziness and headaches.  Endo/Heme/Allergies: Negative.   All other systems reviewed and are negative.   Vitals:   11/05/17 1517  BP: 120/72  Pulse: 68  Resp: 16  Temp: 98.4 F (36.9 C)  SpO2: 98%    Physical Exam  Constitutional: He is oriented to person, place, and time. He appears well-developed and well-nourished.  HENT:  Head: Normocephalic and atraumatic.  Right Ear: External ear normal.  Left Ear: External ear normal.  Nose: Nose normal.  Mouth/Throat: Oropharynx is clear and moist.  Eyes: Conjunctivae and EOM are normal. Pupils are equal, round, and reactive to light.  Neck: Normal range of motion. Neck supple. No JVD present. No thyromegaly present.  Cardiovascular: Normal rate, regular rhythm and normal heart sounds.  Pulmonary/Chest: Effort normal and breath sounds normal.  Abdominal: Soft. He exhibits no distension. There is no tenderness.  Musculoskeletal: Normal range of motion.  Lymphadenopathy:    He has no cervical adenopathy.  Neurological: He is alert and oriented to person, place, and time. No sensory deficit. He exhibits normal muscle tone.  Skin: Skin is warm and dry. Capillary refill takes less than 2 seconds. No rash noted.  Psychiatric: He has a normal mood and  affect. His behavior is normal.  Vitals reviewed.    ASSESSMENT & PLAN: Eric Tyler was seen today for iching.  Diagnoses and all orders for this visit:  Itching -     methylPREDNISolone acetate (DEPO-MEDROL) injection 80 mg -     Discontinue: Triamcinolone Acetonide (TRIAMCINOLONE 0.1 % CREAM : EUCERIN) CREA; Apply 1 application topically 3 (three) times daily. 50:50 mix---apply 5-7 days tid -     cetirizine (ZYRTEC) 10 MG tablet; Take 1 tablet (10 mg total) by mouth daily for 10 days. -     Triamcinolone Acetonide (TRIAMCINOLONE 0.1 % CREAM : EUCERIN) CREA; Apply 1 application topically 3 (three) times daily. 50:50 mix---apply 5-7 days tid  Acute bilateral low back pain without sciatica -     predniSONE (DELTASONE) 20 MG tablet; Take 2 tablets (40 mg total) by mouth daily with breakfast for 5 days.   Patient Instructions       IF you received an x-ray today, you will receive an invoice from [facility]. Please contact [facility] at [phone] with questions or concerns regarding your invoice.   IF you received labwork today, you will receive an invoice from [facility]. Please contact [facility] at [phone] with questions or concerns regarding your invoice.   Our billing staff will not be able to assist you with questions regarding bills from these companies.  You will be contacted with the lab results as soon as they are available. The fastest way to get your results is to activate your My Chart account. Instructions are located on the last page of this paperwork. If you have not heard from [place] regarding the results in 2 weeks, please contact this office.    Pruritus Pruritus is an itching feeling. There are many different conditions and factors that can make your skin itchy. Dry skin is one of the most common causes of itching. Most cases of itching do not require medical attention. Itchy skin can turn into a rash. Follow these instructions at home: Watch your  pruritus for any changes. Take these steps to help with your condition: Skin Care  Moisturize your skin as needed. A moisturizer that contains petroleum jelly is best for keeping moisture in your skin.  Take or apply medicines only as directed by your health care provider. This may include: ? Corticosteroid cream. ? Anti-itch lotions. ? Oral anti-histamines.  Apply cool compresses to the affected areas.  Try taking a bath with: ? Epsom salts. Follow the instructions on the packaging. You can get these at your local pharmacy or grocery store. ? Baking soda. Pour a small amount into the bath as directed by your health care provider. ? Colloidal oatmeal. Follow the instructions on the packaging. You can get this at your local pharmacy or grocery store.  Try applying baking soda paste to your skin. Stir water into baking soda until it reaches a paste-like consistency.  Do not scratch your skin.  Avoid hot showers or baths, which can make itching worse. A cold shower may help with itching as long as you use a moisturizer after.  Avoid scented soaps, detergents, and perfumes. Use gentle soaps, detergents, perfumes, and other cosmetic products. General instructions  Avoid wearing tight clothes.  Keep a journal to help track what causes your itch. Write down: ? What you eat. ? What cosmetic products you use. ? What you drink. ? What you wear. This includes jewelry.  Use a humidifier. This keeps the air moist, which helps to prevent dry skin. Contact a health care provider if:  The itching does not go away after several days.  You sweat at night.  You have weight loss.  You are unusually thirsty.  You urinate more than normal.  You are more tired than normal.  You have abdominal pain.  Your skin tingles.  You feel weak.  Your skin or the whites of your eyes look yellow (jaundice).  Your skin feels numb. This information is not intended to replace advice given to you  by your health care provider. Make sure you discuss any questions you have with your health care provider. Document Released: 07/29/2011 Document Revised: 04/23/2016 Document Reviewed: 11/12/2014 Elsevier Interactive Patient Education  2018 Elsevier Inc.      Agustina Caroli, MD [facility]

## 2017-11-05 NOTE — Patient Instructions (Addendum)
     IF you received an x-ray today, you will receive an invoice from [facility]. Please contact  Radiology at [phone] with questions or concerns regarding your invoice.   IF you received labwork today, you will receive an invoice from [facility]. Please contact [facility] at [phone] with questions or concerns regarding your invoice.   Our billing staff will not be able to assist you with questions regarding bills from these companies.  You will be contacted with the lab results as soon as they are available. The fastest way to get your results is to activate your My Chart account. Instructions are located on the last page of this paperwork. If you have not heard from us regarding the results in 2 weeks, please contact this office.     Pruritus Pruritus is an itching feeling. There are many different conditions and factors that can make your skin itchy. Dry skin is one of the most common causes of itching. Most cases of itching do not require medical attention. Itchy skin can turn into a rash. Follow these instructions at home: Watch your pruritus for any changes. Take these steps to help with your condition: Skin Care  Moisturize your skin as needed. A moisturizer that contains petroleum jelly is best for keeping moisture in your skin.  Take or apply medicines only as directed by your health care provider. This may include: ? Corticosteroid cream. ? Anti-itch lotions. ? Oral anti-histamines.  Apply cool compresses to the affected areas.  Try taking a bath with: ? Epsom salts. Follow the instructions on the packaging. You can get these at your local pharmacy or grocery store. ? Baking soda. Pour a small amount into the bath as directed by your health care provider. ? Colloidal oatmeal. Follow the instructions on the packaging. You can get this at your local pharmacy or grocery store.  Try applying baking soda paste to your skin. Stir water into baking soda  until it reaches a paste-like consistency.  Do not scratch your skin.  Avoid hot showers or baths, which can make itching worse. A cold shower may help with itching as long as you use a moisturizer after.  Avoid scented soaps, detergents, and perfumes. Use gentle soaps, detergents, perfumes, and other cosmetic products. General instructions  Avoid wearing tight clothes.  Keep a journal to help track what causes your itch. Write down: ? What you eat. ? What cosmetic products you use. ? What you drink. ? What you wear. This includes jewelry.  Use a humidifier. This keeps the air moist, which helps to prevent dry skin. Contact a health care provider if:  The itching does not go away after several days.  You sweat at night.  You have weight loss.  You are unusually thirsty.  You urinate more than normal.  You are more tired than normal.  You have abdominal pain.  Your skin tingles.  You feel weak.  Your skin or the whites of your eyes look yellow (jaundice).  Your skin feels numb. This information is not intended to replace advice given to you by your health care provider. Make sure you discuss any questions you have with your health care provider. Document Released: 07/29/2011 Document Revised: 04/23/2016 Document Reviewed: 11/12/2014 Elsevier Interactive Patient Education  2018 Elsevier Inc.  

## 2017-11-17 DIAGNOSIS — L299 Pruritus, unspecified: Secondary | ICD-10-CM | POA: Diagnosis not present

## 2017-12-31 ENCOUNTER — Emergency Department (HOSPITAL_COMMUNITY)
Admission: EM | Admit: 2017-12-31 | Discharge: 2017-12-31 | Disposition: A | Discharge status: Home / Self Care | Payer: PPO | Attending: Emergency Medicine | Admitting: Emergency Medicine

## 2017-12-31 ENCOUNTER — Encounter (HOSPITAL_COMMUNITY): Payer: Self-pay | Admitting: Emergency Medicine

## 2017-12-31 ENCOUNTER — Other Ambulatory Visit: Payer: Self-pay

## 2017-12-31 DIAGNOSIS — Z79899 Other long term (current) drug therapy: Secondary | ICD-10-CM | POA: Diagnosis not present

## 2017-12-31 DIAGNOSIS — B360 Pityriasis versicolor: Secondary | ICD-10-CM | POA: Diagnosis not present

## 2017-12-31 DIAGNOSIS — R21 Rash and other nonspecific skin eruption: Secondary | ICD-10-CM | POA: Diagnosis not present

## 2017-12-31 MED ORDER — SALICYLIC ACID 3 % EX SHAM: MEDICATED_SHAMPOO | CUTANEOUS | 0 refills | Status: DC

## 2017-12-31 NOTE — Discharge Instructions (Signed)
Use the shampoo and follow up with your doctor. If the fungal infection is not getting better you may need to see a dermatologist.

## 2017-12-31 NOTE — ED Provider Notes (Signed)
[facility] DEPT Provider Note   CSN: [id number] Arrival date & time: 12/31/17  1217     History   Chief Complaint Chief Complaint  Patient presents with  . Rash    HPI Eric Tyler is a 72 y.o. male who presents to the ED with a rash. The rash started about 2 months ago and has gotten worse. The rash is generalized.Patient has taken Benadryl and OTC creams without relief.   The history is provided by the patient. No language interpreter was used.  Rash   This is a new problem. The current episode started more than 1 week ago. The problem has been gradually worsening. The problem is associated with nothing. There has been no fever. He has tried OTC analgesics, anti-itch cream and antihistamines for the symptoms.    Past Medical History:  Diagnosis Date  . Allergy     There are no active problems to display for this patient.   Past Surgical History:  Procedure Laterality Date  . FINGER SURGERY         Home Medications    Prior to Admission medications   Medication Sig Start Date End Date Taking? Authorizing Provider  Acetaminophen (TYLENOL 8 HOUR ARTHRITIS PAIN PO) Take 1 tablet by mouth 3 (three) times daily as needed. 09/24/17   [provider]  cetirizine (ZYRTEC) 10 MG tablet Take 1 tablet (10 mg total) by mouth daily for 10 days. 11/05/17 11/15/17  Horald Pollen, MD  Multiple Vitamin (MULTIVITAMIN WITH MINERALS) TABS tablet Take 1 tablet by mouth daily.    [provider]  omega-3 acid ethyl esters (LOVAZA) 1 G capsule Take 1 g by mouth 2 (two) times daily.    [provider]  Salicylic Acid (SELSUN BLUE NATURALS) 3 % SHAM Apply to affected area and leave on for 30 minutes prior to shower 12/31/17   Debroah Baller M, NP  Triamcinolone Acetonide (TRIAMCINOLONE 0.1 % CREAM : EUCERIN) CREA Apply 1 application topically 3 (three) times daily. 50:50 mix---apply 5-7 days tid 11/05/17   Horald Pollen, MD     Family History Family History  Problem Relation Age of Onset  . Hypertension Mother   . Hypertension Father   . Hyperlipidemia Father     Social History Social History   Tobacco Use  . Smoking status: Never Smoker  . Smokeless tobacco: Never Used  Substance Use Topics  . Alcohol use: No    Alcohol/week: 0.0 oz  . Drug use: No     Allergies   Patient has no known allergies.   Review of Systems Review of Systems  Skin: Positive for rash.  All other systems reviewed and are negative.    Physical Exam Updated Vital Signs BP (!) 132/93 (BP Location: Left Arm)   Pulse 71   Temp 97.9 F (36.6 C) (Oral)   Resp 16   SpO2 98%   Physical Exam  Constitutional: He appears well-developed and well-nourished. No distress.  HENT:  Head: Normocephalic.  Eyes: EOM are normal.  Neck: Neck supple.  Cardiovascular: Normal rate and regular rhythm.  Pulmonary/Chest: Effort normal and breath sounds normal.  Musculoskeletal: Normal range of motion.  Neurological: He is alert.  Skin: Skin is warm and dry. Rash noted.  There are multiple dark-colored patches noted to the back, chest and upper arms.  Psychiatric: He has a normal mood and affect. His behavior is normal.  Nursing note and vitals reviewed.    ED Treatments /  Results  Labs (all labs ordered are listed, but only abnormal results are displayed) Labs Reviewed - No data to display  Radiology No results found.  Procedures Procedures (including critical care time)  Medications Ordered in ED Medications - No data to display  Dr. Venora Maples in to see the patient and agrees with A/P.  Initial Impression / Assessment and Plan / ED Course  I have reviewed the triage vital signs and the nursing notes. SUBJECTIVE:  Eric Tyler is a 72 y.o. male who complains of abnormally colored flat skin lesions for 2 months.   OBJECTIVE:  Appears well, in no apparent distress.  Vital signs are normal. Classic tinea  versicolor lesions primarily on the back and chest.   ASSESSMENT: Classic Tinea Versicolor  PLAN: Explained chronic and relapsing nature of this problem. Patient will apply Selsun shampoo to areas with lesions and let stand and then wash off, repeat weekly until clear. Patient to f/u with his PCP and may need f/u with dermatology is symptoms persist.   Final Clinical Impressions(s) / ED Diagnoses   Final diagnoses:  Tinea versicolor    ED Discharge Orders        Ordered    Salicylic Acid [facility] BLUE NATURALS) 3 % SHAM     12/31/17 [place], [place], [place] 12/31/17 Heeia, Kevin, MD 12/31/17 1555

## 2017-12-31 NOTE — ED Triage Notes (Signed)
Pt complaint of generalized rash since yesterday; denies new soaps, medications, or foods.

## 2018-03-10 DIAGNOSIS — L308 Other specified dermatitis: Secondary | ICD-10-CM | POA: Diagnosis not present

## 2018-03-25 ENCOUNTER — Inpatient Hospital Stay: Payer: PPO

## 2018-03-25 ENCOUNTER — Telehealth: Payer: Self-pay | Admitting: Oncology

## 2018-03-25 ENCOUNTER — Inpatient Hospital Stay: Payer: PPO | Attending: Oncology | Admitting: Oncology

## 2018-03-25 VITALS — BP 127/75 | HR 64 | Temp 97.8°F | Resp 17 | Ht 71.0 in | Wt 198.7 lb

## 2018-03-25 DIAGNOSIS — D709 Neutropenia, unspecified: Secondary | ICD-10-CM

## 2018-03-25 DIAGNOSIS — D72819 Decreased white blood cell count, unspecified: Secondary | ICD-10-CM | POA: Insufficient documentation

## 2018-03-25 LAB — CBC WITH DIFFERENTIAL/PLATELET
BASOS ABS: 0 10*3/uL (ref 0.0–0.1)
Basophils Relative: 1 %
Eosinophils Absolute: 0.2 10*3/uL (ref 0.0–0.5)
Eosinophils Relative: 4 %
HEMATOCRIT: 40.2 % (ref 38.4–49.9)
HEMOGLOBIN: 13.2 g/dL (ref 13.0–17.1)
LYMPHS PCT: 38 %
Lymphs Abs: 1.4 10*3/uL (ref 0.9–3.3)
MCH: 31.2 pg (ref 27.2–33.4)
MCHC: 32.8 g/dL (ref 32.0–36.0)
MCV: 95 fL (ref 79.3–98.0)
MONO ABS: 0.4 10*3/uL (ref 0.1–0.9)
MONOS PCT: 11 %
NEUTROS ABS: 1.7 10*3/uL (ref 1.5–6.5)
Neutrophils Relative %: 46 %
Platelets: 269 10*3/uL (ref 140–400)
RBC: 4.23 MIL/uL (ref 4.20–5.82)
RDW: 13.9 % (ref 11.0–14.6)
WBC: 3.7 10*3/uL — ABNORMAL LOW (ref 4.0–10.3)

## 2018-03-25 LAB — SAVE SMEAR

## 2018-03-25 NOTE — Telephone Encounter (Signed)
No LOS 4/26 °

## 2018-03-25 NOTE — Progress Notes (Signed)
Hematology and Oncology Follow Up Visit  Eric Tyler [id number] 1946/10/20 72 y.o. 03/25/2018 10:25 AM Eric Tyler, MDPharr, Eric Jew, MD   Principle Diagnosis: 73 year old gentleman with a leukocytopenia and normal differential diagnosed in 2016.   Current therapy: Active surveillance.  Interim History: Mr. Eric Tyler presents today for a follow-up visit.  He is a pleasant gentleman I saw in consultation in October 2018 for the evaluation of leukocytopenia.  Since his last visit, he did develop a skin rash that has been treated with topical creams.  He denies any constitutional symptoms of weight loss or appetite changes.  He denies any excessive fatigue or tiredness.  He denied any recurrent infections or lymphadenopathy.  His appetite and performance status remain excellent.  He does not report any headaches, blurry vision, syncope or seizures. Does not report any fevers, chills or sweats.  Does not report any cough, wheezing or hemoptysis.  Does not report any chest pain, palpitation, orthopnea or leg edema.  Does not report any nausea, vomiting or abdominal pain.  Does not report any constipation or diarrhea.  Does not report any skeletal complaints.    Does not report frequency, urgency or hematuria. Does not report any heat or cold intolerance.  Does not report any lymphadenopathy or petechiae.   Remaining review of systems is negative.    Medications: I have reviewed the patient's current medications.  Current Outpatient Medications  Medication Sig Dispense Refill  . Acetaminophen (TYLENOL 8 HOUR ARTHRITIS PAIN PO) Take 1 tablet by mouth 3 (three) times daily as needed.    . cetirizine (ZYRTEC) 10 MG tablet Take 1 tablet (10 mg total) by mouth daily for 10 days. 10 tablet 3  . Multiple Vitamin (MULTIVITAMIN WITH MINERALS) TABS tablet Take 1 tablet by mouth daily.    Marland Kitchen omega-3 acid ethyl esters (LOVAZA) 1 G capsule Take 1 g by mouth 2 (two) times daily.    . Salicylic Acid (SELSUN BLUE  NATURALS) 3 % SHAM Apply to affected area and leave on for 30 minutes prior to shower 236 mL 0  . Triamcinolone Acetonide (TRIAMCINOLONE 0.1 % CREAM : EUCERIN) CREA Apply 1 application topically 3 (three) times daily. 50:50 mix---apply 5-7 days tid 1 each 0   No current facility-administered medications for this visit.      Allergies: No Known Allergies  Past Medical History, Surgical history, Social history, and Family History were reviewed and updated.    Physical Exam:  Blood pressure 127/75, pulse 64, temperature 97.8 F (36.6 C), temperature source Oral, resp. rate 17, height 5\' 11"  (1.803 m), weight 198 lb 11.2 oz (90.1 kg), SpO2 100 %.   ECOG: 1 General appearance: alert and cooperative appeared without distress. Head: Normocephalic, without obvious abnormality Oropharynx: No oral thrush or ulcers. Eyes: No scleral icterus.  Pupils are equal and round reactive to light. Lymph nodes: Cervical, supraclavicular, and axillary nodes normal. Heart:regular rate and rhythm, S1, S2 normal, no murmur, click, rub or gallop Lung:chest clear, no wheezing, rales, normal symmetric air entry Abdomin: soft, non-tender, without masses or organomegaly. Neurological: No motor, sensory deficits.  Intact deep tendon reflexes. Skin: No rashes or lesions.  No ecchymosis or petechiae. Musculoskeletal: No joint deformity or effusion. Psychiatric: Mood and affect are appropriate.    Lab Results: Lab Results  Component Value Date   WBC 3.7 (L) 03/25/2018   HGB 13.2 03/25/2018   HCT 40.2 03/25/2018   MCV 95.0 03/25/2018   PLT 269 03/25/2018     Chemistry  Component Value Date/Time   NA 141 10/18/2015 0635   K 4.2 10/18/2015 0635   CL 105 10/18/2015 0635   CO2 27 10/18/2015 0635   BUN 14 10/18/2015 0635   CREATININE 1.19 10/18/2015 0635      Component Value Date/Time   CALCIUM 9.6 10/18/2015 0635   ALKPHOS 55 10/18/2015 0635   AST 30 10/18/2015 0635   ALT 28 10/18/2015 0635    BILITOT 0.9 10/18/2015 0635        Impression and Plan:  72 year old gentleman with the following issues:  1.  Leukocytopenia with a normal white cell count differential noted in 2016.  His CBC was repeated today and was personally reviewed and discussed with the patient.  His white cell count today is close to normal range at 3.7 with a normal differential.  His hemoglobin, platelet count as well as his indices are all within normal range.  The etiology of his leukocytopenia is likely related to ethnic variation but and benign causes.  I do not see any evidence to suggest a hematological disorder and I see no need for further intervention or evaluation.  It is likely his white cell count will fluctuate between 3.1 and normal range all his life.  2.  Follow-up: I am happy to see him in the future as needed.  15  minutes was spent with the patient face-to-face today.  More than 50% of time was dedicated to patient counseling, education and coordination of his care.    Zola Button, MD 4/26/201910:25 AM

## 2018-08-18 DIAGNOSIS — Z0000 Encounter for general adult medical examination without abnormal findings: Secondary | ICD-10-CM | POA: Diagnosis not present

## 2018-08-18 DIAGNOSIS — Z1212 Encounter for screening for malignant neoplasm of rectum: Secondary | ICD-10-CM | POA: Diagnosis not present

## 2018-08-18 DIAGNOSIS — D72819 Decreased white blood cell count, unspecified: Secondary | ICD-10-CM | POA: Diagnosis not present

## 2018-08-22 DIAGNOSIS — R7303 Prediabetes: Secondary | ICD-10-CM | POA: Diagnosis not present

## 2018-08-22 DIAGNOSIS — H903 Sensorineural hearing loss, bilateral: Secondary | ICD-10-CM | POA: Diagnosis not present

## 2018-08-22 DIAGNOSIS — D72819 Decreased white blood cell count, unspecified: Secondary | ICD-10-CM | POA: Diagnosis not present

## 2018-08-22 DIAGNOSIS — Z1212 Encounter for screening for malignant neoplasm of rectum: Secondary | ICD-10-CM | POA: Diagnosis not present

## 2018-08-22 DIAGNOSIS — R0789 Other chest pain: Secondary | ICD-10-CM | POA: Diagnosis not present

## 2018-08-22 DIAGNOSIS — Z23 Encounter for immunization: Secondary | ICD-10-CM | POA: Diagnosis not present

## 2018-08-22 DIAGNOSIS — Q800 Ichthyosis vulgaris: Secondary | ICD-10-CM | POA: Diagnosis not present

## 2018-08-22 DIAGNOSIS — Z0000 Encounter for general adult medical examination without abnormal findings: Secondary | ICD-10-CM | POA: Diagnosis not present

## 2018-08-22 DIAGNOSIS — R0989 Other specified symptoms and signs involving the circulatory and respiratory systems: Secondary | ICD-10-CM | POA: Diagnosis not present

## 2018-08-22 DIAGNOSIS — Z6827 Body mass index (BMI) 27.0-27.9, adult: Secondary | ICD-10-CM | POA: Diagnosis not present

## 2018-08-22 DIAGNOSIS — R413 Other amnesia: Secondary | ICD-10-CM | POA: Diagnosis not present

## 2018-08-22 DIAGNOSIS — H9193 Unspecified hearing loss, bilateral: Secondary | ICD-10-CM | POA: Diagnosis not present

## 2018-08-25 ENCOUNTER — Other Ambulatory Visit: Payer: Self-pay | Admitting: Internal Medicine

## 2018-08-25 DIAGNOSIS — E010 Iodine-deficiency related diffuse (endemic) goiter: Secondary | ICD-10-CM

## 2018-08-29 ENCOUNTER — Other Ambulatory Visit: Payer: Self-pay | Admitting: Cardiology

## 2018-08-29 DIAGNOSIS — I10 Essential (primary) hypertension: Secondary | ICD-10-CM | POA: Diagnosis not present

## 2018-08-29 DIAGNOSIS — R079 Chest pain, unspecified: Secondary | ICD-10-CM

## 2018-08-29 DIAGNOSIS — R0789 Other chest pain: Secondary | ICD-10-CM | POA: Diagnosis not present

## 2018-08-29 DIAGNOSIS — R7303 Prediabetes: Secondary | ICD-10-CM | POA: Diagnosis not present

## 2018-08-30 HISTORY — PX: NM MYOVIEW LTD: HXRAD82

## 2018-09-01 ENCOUNTER — Ambulatory Visit
Admission: RE | Admit: 2018-09-01 | Discharge: 2018-09-01 | Disposition: A | Discharge status: Home / Self Care | Payer: PPO | Source: Ambulatory Visit | Attending: Internal Medicine | Admitting: Internal Medicine

## 2018-09-01 DIAGNOSIS — E010 Iodine-deficiency related diffuse (endemic) goiter: Secondary | ICD-10-CM

## 2018-09-01 DIAGNOSIS — E041 Nontoxic single thyroid nodule: Secondary | ICD-10-CM | POA: Diagnosis not present

## 2018-09-05 ENCOUNTER — Encounter (HOSPITAL_COMMUNITY)
Admission: RE | Admit: 2018-09-05 | Discharge: 2018-09-05 | Disposition: A | Discharge status: Home / Self Care | Payer: PPO | Source: Ambulatory Visit | Attending: Cardiology | Admitting: Cardiology

## 2018-09-05 DIAGNOSIS — R079 Chest pain, unspecified: Secondary | ICD-10-CM | POA: Insufficient documentation

## 2018-09-05 DIAGNOSIS — R9431 Abnormal electrocardiogram [ECG] [EKG]: Secondary | ICD-10-CM | POA: Diagnosis not present

## 2018-09-05 DIAGNOSIS — R7303 Prediabetes: Secondary | ICD-10-CM | POA: Diagnosis not present

## 2018-09-05 DIAGNOSIS — R0789 Other chest pain: Secondary | ICD-10-CM | POA: Diagnosis not present

## 2018-09-05 DIAGNOSIS — I10 Essential (primary) hypertension: Secondary | ICD-10-CM | POA: Diagnosis not present

## 2018-09-05 MED ORDER — REGADENOSON 0.4 MG/5ML IV SOLN
0.4000 mg | Freq: Once | INTRAVENOUS | Status: AC
  Administered 2018-09-05: 0.4 mg via INTRAVENOUS

## 2018-09-05 MED ORDER — TECHNETIUM TC 99M TETROFOSMIN IV KIT
30.0000 | PACK | Freq: Once | INTRAVENOUS | Status: AC | PRN
  Administered 2018-09-05: 30 via INTRAVENOUS

## 2018-09-05 MED ORDER — TECHNETIUM TC 99M TETROFOSMIN IV KIT
10.0000 | PACK | Freq: Once | INTRAVENOUS | Status: AC | PRN
  Administered 2018-09-05: 10 via INTRAVENOUS

## 2018-09-05 MED ORDER — REGADENOSON 0.4 MG/5ML IV SOLN
INTRAVENOUS | Status: AC
  Administered 2018-09-05: 0.4 mg via INTRAVENOUS
  Filled 2018-09-05: qty 5

## 2018-09-07 DIAGNOSIS — I10 Essential (primary) hypertension: Secondary | ICD-10-CM | POA: Diagnosis not present

## 2018-09-07 DIAGNOSIS — R7303 Prediabetes: Secondary | ICD-10-CM | POA: Diagnosis not present

## 2018-09-07 DIAGNOSIS — R9439 Abnormal result of other cardiovascular function study: Secondary | ICD-10-CM | POA: Diagnosis not present

## 2018-09-07 DIAGNOSIS — I208 Other forms of angina pectoris: Secondary | ICD-10-CM | POA: Diagnosis not present

## 2018-09-13 ENCOUNTER — Ambulatory Visit: Payer: PPO | Admitting: Internal Medicine

## 2018-10-04 ENCOUNTER — Telehealth: Payer: Self-pay | Admitting: Neurology

## 2018-10-04 ENCOUNTER — Encounter: Payer: Self-pay | Admitting: Neurology

## 2018-10-04 ENCOUNTER — Ambulatory Visit (INDEPENDENT_AMBULATORY_CARE_PROVIDER_SITE_OTHER): Payer: PPO | Admitting: Neurology

## 2018-10-04 VITALS — BP 112/62 | HR 68 | Ht 73.0 in | Wt 205.0 lb

## 2018-10-04 DIAGNOSIS — G3184 Mild cognitive impairment, so stated: Secondary | ICD-10-CM | POA: Diagnosis not present

## 2018-10-04 DIAGNOSIS — R413 Other amnesia: Secondary | ICD-10-CM

## 2018-10-04 DIAGNOSIS — R4189 Other symptoms and signs involving cognitive functions and awareness: Secondary | ICD-10-CM | POA: Insufficient documentation

## 2018-10-04 NOTE — Telephone Encounter (Signed)
Health team order sent to GI. No auth they will reach out to the pt to schedule.  °

## 2018-10-04 NOTE — Telephone Encounter (Signed)
lvm for pt to be aware of this. I also left GI phone number of [phone] and to give them a call if he has not heard in the next 2-3 business days.

## 2018-10-04 NOTE — Progress Notes (Signed)
PATIENT: Eric Tyler DOB: 04/11/1946  Chief Complaint  Patient presents with  . Memory Loss    New pt, wife- Eric Tyler  MMSE 39     HISTORICAL  TREAVOR Tyler is a 72 year old male, seen in request by his primary care physician Dr. Shelia Tyler, Eric Tyler for evaluation of memory loss, initial evaluation was on October 04, 2018.  He is accompanied by his wife for 4 years.  I have reviewed and summarized the referring note from the referring physician.  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  ALLERGIES: No Known Allergies  HOME MEDICATIONS: Current Outpatient Medications  Medication Sig Dispense Refill  . Acetaminophen (TYLENOL 8 HOUR ARTHRITIS PAIN PO) Take 1 tablet by mouth 3 (three) times daily as needed.    Marland Kitchen aspirin EC 81 MG tablet Take 81 mg by mouth daily.    . cetirizine (ZYRTEC) 10 MG tablet Take 1 tablet (10 mg total) by mouth daily for 10 days. 10 tablet 3  . metoprolol succinate (TOPROL-XL) 25 MG 24 hr tablet 12.5 mg daily.  3  . Multiple Vitamin (MULTIVITAMIN WITH MINERALS) TABS tablet Take 1 tablet by mouth daily.    Marland Kitchen omega-3 acid ethyl esters (LOVAZA) 1 G capsule Take 1 g by mouth 2 (two) times daily.    . Salicylic Acid (SELSUN BLUE NATURALS) 3 % SHAM Apply to affected area and leave on for 30 minutes prior to shower 236 mL 0  . Triamcinolone Acetonide (TRIAMCINOLONE 0.1 % CREAM :  EUCERIN) CREA Apply 1 application topically 3 (three) times daily. 50:50 mix---apply 5-7 days tid 1 each 0   No current facility-administered medications for this visit.     PAST MEDICAL HISTORY: Past Medical History:  Diagnosis Date  . Allergy     PAST SURGICAL HISTORY: Past Surgical History:  Procedure Laterality Date  . FINGER SURGERY      FAMILY HISTORY: Family History  Problem Relation Age of Onset  . Hypertension Mother   . Hypertension Father   . Hyperlipidemia Father     SOCIAL HISTORY: Social History   Socioeconomic History  . Marital status: Married    Spouse name: Eric Tyler  . Number of children: 8  . Years of education: 54  . Highest education level: Not on file  Occupational History    Comment: retired  Scientific laboratory technician  . Financial resource strain: Not on file  . Food insecurity:    Worry: Not on file    Inability: Not on file  . Transportation needs:    Medical: Not on file    Non-medical: Not on file  Tobacco Use  . Smoking status: Never Smoker  . Smokeless tobacco: Never Used  Substance and Sexual Activity  . Alcohol use: No    Alcohol/week: 0.0 standard drinks  . Drug use: No  . Sexual activity: Not on file  Lifestyle  . Physical activity:    Days per  week: Not on file    Minutes per session: Not on file  . Stress: Not on file  Relationships  . Social connections:    Talks on phone: Not on file    Gets together: Not on file    Attends religious service: Not on file    Active member of club or organization: Not on file    Attends meetings of clubs or organizations: Not on file    Relationship status: Not on file  . Intimate partner violence:    Fear of current or ex partner: Not on file    Emotionally abused: Not on file    Physically abused: Not on file    Forced sexual activity: Not on file  Other Topics Concern  . Not on file  Social History Narrative   Lives with wife     PHYSICAL EXAM   Vitals:   10/04/18 0732  BP: 112/62    Pulse: 68  Weight: 205 lb (93 kg)  Height: 6\' 1"  (1.854 m)    Not recorded      Body mass index is 27.05 kg/m.  PHYSICAL EXAMNIATION:  Gen: NAD, conversant, well nourised, obese, well groomed                     Cardiovascular: Regular rate rhythm, no peripheral edema, warm, nontender. Eyes: Conjunctivae clear without exudates or hemorrhage Neck: Supple, no carotid bruits. Pulmonary: Clear to auscultation bilaterally   NEUROLOGICAL EXAM:  MENTAL STATUS: MMSE - Mini Mental State Exam 10/04/2018  Orientation to time 5  Orientation to Place 4  Registration 3  Attention/ Calculation 3  Recall 1  Language- name 2 objects 2  Language- repeat 0  Language- follow 3 step command 3  Language- read & follow direction 1  Write a sentence 1  Copy design 0  Total score 23  animal naming 9,   Katz index of independence in activities of daily living. Lawton-brody instrumental activities of daily living skill.  8   CRANIAL NERVES: CN II: Visual fields are full to confrontation. Fundoscopic exam is normal with sharp discs and no vascular changes. Pupils are round equal and briskly reactive to light. CN III, IV, VI: extraocular movement are normal. No ptosis. CN V: Facial sensation is intact to pinprick in all 3 divisions bilaterally. Corneal responses are intact.  CN VII: Face is symmetric with normal eye closure and smile. CN VIII: Hearing is normal to rubbing fingers CN IX, X: Palate elevates symmetrically. Phonation is normal. CN XI: Head turning and shoulder shrug are intact CN XII: Tongue is midline with normal movements and no atrophy.  MOTOR: There is no pronator drift of out-stretched arms. Muscle bulk and tone are normal. Muscle strength is normal.  REFLEXES: Reflexes are 2+ and symmetric at the biceps, triceps, knees, and ankles. Plantar responses are flexor.  SENSORY: Intact to light touch, pinprick, positional sensation and vibratory sensation are intact in fingers  and toes.  COORDINATION: Rapid alternating movements and fine finger movements are intact. There is no dysmetria on finger-to-nose and heel-knee-shin.    GAIT/STANCE: Posture is normal. Gait is steady with normal steps, base, arm swing, and turning. Heel and toe walking are normal. Tandem gait is normal.  Romberg is absent.   DIAGNOSTIC DATA (LABS, IMAGING, TESTING) - I reviewed patient records, labs, notes, testing and imaging myself where available.   ASSESSMENT AND PLAN  GEOVANIE WINNETT is a 72 y.o. male   Mild cognitive impairment  Most likely central nervous system degenerative disorder  Mini-Mental status 23/30  MRI of brain  Laboratory evaluations   Marcial Pacas, M.D. Ph.D.  [facility] [address], [place], [place] [address] Ph: [phone] Fax: [phone]  CC: [name], MD

## 2018-10-05 ENCOUNTER — Telehealth: Payer: Self-pay | Admitting: *Deleted

## 2018-10-05 LAB — TSH: TSH: 0.972 u[IU]/mL (ref 0.450–4.500)

## 2018-10-05 LAB — VITAMIN B12: VITAMIN B 12: 380 pg/mL (ref 232–1245)

## 2018-10-05 LAB — RPR: RPR Ser Ql: NONREACTIVE

## 2018-10-05 NOTE — Telephone Encounter (Signed)
-----   Message from Marcial Pacas, MD sent at 10/05/2018  1:03 PM EST ----- Please call patient for normal laboratory result

## 2018-10-05 NOTE — Telephone Encounter (Signed)
Spoke to patient's wife on [place].  She is aware of his lab results.

## 2018-10-24 ENCOUNTER — Ambulatory Visit
Admission: RE | Admit: 2018-10-24 | Discharge: 2018-10-24 | Disposition: A | Discharge status: Home / Self Care | Payer: PPO | Source: Ambulatory Visit | Attending: Neurology | Admitting: Neurology

## 2018-10-24 DIAGNOSIS — R413 Other amnesia: Secondary | ICD-10-CM

## 2018-10-25 ENCOUNTER — Telehealth: Payer: Self-pay | Admitting: Neurology

## 2018-10-25 NOTE — Telephone Encounter (Signed)
Spoke to patient and his wife (on [place]) - they are both aware of his MRI results and verbalized understanding.  He will keep his pending follow up for further review.

## 2018-10-25 NOTE — Telephone Encounter (Signed)
MRI brain showed generalized atrophy, supratentorium small vessel disease, no acute abnormality, I will review films with him at next follow-up visit.   IMPRESSION: This MRI of the brain with and without contrast shows the following: 1.    There is mild generalized cortical atrophy. 2.    Mild extent of T2/FLAIR hypertense foci in the hemispheres consistent with mild chronic microvascular ischemic change.  None of the foci appear to be acute. 3.    There are no acute findings.

## 2018-11-28 DIAGNOSIS — R7303 Prediabetes: Secondary | ICD-10-CM | POA: Diagnosis not present

## 2018-11-28 DIAGNOSIS — I208 Other forms of angina pectoris: Secondary | ICD-10-CM | POA: Diagnosis not present

## 2018-11-28 DIAGNOSIS — E785 Hyperlipidemia, unspecified: Secondary | ICD-10-CM | POA: Diagnosis not present

## 2018-11-28 DIAGNOSIS — I10 Essential (primary) hypertension: Secondary | ICD-10-CM | POA: Diagnosis not present

## 2019-01-03 ENCOUNTER — Encounter: Payer: Self-pay | Admitting: Neurology

## 2019-01-03 ENCOUNTER — Ambulatory Visit: Payer: Medicare HMO | Admitting: Neurology

## 2019-01-03 VITALS — BP 125/71 | HR 67 | Ht 73.0 in | Wt 207.0 lb

## 2019-01-03 DIAGNOSIS — G3184 Mild cognitive impairment, so stated: Secondary | ICD-10-CM

## 2019-01-03 NOTE — Patient Instructions (Signed)
We discussed trying two memory medications: Aricept and Namenda. Please consider these medications and let us know if you would like to try them. We are happy to help.

## 2019-01-03 NOTE — Progress Notes (Signed)
PATIENT: Eric Tyler DOB: [birth year]  REASON FOR VISIT: follow up for mild cognitive impairment  HISTORY FROM: patient and his wife   HISTORY OF PRESENT ILLNESS: Today 01/03/19  Eric Tyler is a 73 year old male who presents for follow-up today with his wife, Mae for memory impairment. He mostly reports trouble finding his words; no trouble with names. His wife does the bills, and it has always been that way. Sometimes when he is reading there will be a pause with his reading of words. His wife reports sometimes he gets frustrated when he is asked too many questions because he can't get the words out quick enough. He does have a stutter. He likes fishing. He is driving and doing well. He does have hearing loss and wears hearing aids. He is looking for a home phone that the volume can be increased. 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 He and his wife just returned just returned from a weekend trip to [place] where he drove most of the way. He presents today for follow-up.   HISTORY  Eric Tyler is a 73 year old male, seen in request by his primary care physician Dr. Shelia Media, Thayer Jew for evaluation of memory loss, initial evaluation was on October 04, 2018.  He is accompanied by his wife for 4 years.  I have reviewed and summarized the referring note from the referring physician.  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  ALLERGIES: No Known Allergies  HOME MEDICATIONS: Outpatient Medications Prior to Visit  Medication Sig Dispense Refill  . Acetaminophen (TYLENOL 8 HOUR ARTHRITIS PAIN PO) Take 1 tablet by mouth 3 (three) times daily as needed.    Marland Kitchen aspirin EC 81 MG tablet Take 81 mg by mouth daily.    . metoprolol succinate (TOPROL-XL) 25 MG 24 hr tablet 12.5 mg daily.  3  . Multiple Vitamin (MULTIVITAMIN WITH MINERALS) TABS tablet Take 1 tablet by mouth daily.    Marland Kitchen omega-3 acid ethyl esters (LOVAZA) 1 G capsule Take 1 g by mouth 2 (two) times daily.    . Salicylic Acid (SELSUN BLUE NATURALS) 3 % SHAM Apply to affected area and leave on for 30 minutes prior to shower 236 mL 0  . Triamcinolone Acetonide (TRIAMCINOLONE 0.1 % CREAM : EUCERIN) CREA Apply 1 application topically 3 (three) times daily. 50:50 mix---apply 5-7 days tid 1 each 0  . cetirizine (ZYRTEC) 10 MG tablet Take 1 tablet (10 mg total) by mouth daily for 10 days. 10 tablet 3   No facility-administered medications prior to visit.     PAST MEDICAL HISTORY: Past Medical History:  Diagnosis Date  . Allergy     PAST SURGICAL HISTORY: Past Surgical History:  Procedure Laterality Date  . FINGER SURGERY      FAMILY HISTORY: Family History  Problem Relation Age of Onset  . Hypertension Mother   . Hypertension Father   . Hyperlipidemia  Father     SOCIAL HISTORY: Social History   Socioeconomic History  . Marital status: Married    Spouse name: [name]  . Number of children: 8  . Years of education: 2  . Highest education level: Not on file  Occupational History    Comment: retired  Scientific laboratory technician  . Financial resource strain: Not on file  . Food insecurity:    Worry: Not on file    Inability: Not on file  . Transportation needs:    Medical: Not on file    Non-medical: Not on file  Tobacco Use  . Smoking status: Never Smoker  . Smokeless tobacco: Never Used  Substance and Sexual Activity  .  Alcohol use: No    Alcohol/week: 0.0 standard drinks  . Drug use: No  . Sexual activity: Not on file  Lifestyle  . Physical activity:    Days per week: Not on file    Minutes per session: Not on file  . Stress: Not on file  Relationships  . Social connections:    Talks on phone: Not on file    Gets together: Not on file    Attends religious service: Not on file    Active member of club or organization: Not on file    Attends meetings of clubs or organizations: Not on file    Relationship status: Not on file  . Intimate partner violence:    Fear of current or ex partner: Not on file    Emotionally abused: Not on file    Physically abused: Not on file    Forced sexual activity: Not on file  Other Topics Concern  . Not on file  Social History Narrative   Lives with wife      PHYSICAL EXAM  Vitals:   01/03/19 0820  Height: 6\' 1"  (1.854 m)   Body mass index is 27.05 kg/m.  Generalized: Well developed, in no acute distress   Neurological examination  Mentation: Alert oriented to time, place, history taking. Follows all commands speech and language is impaired by stuttering; delayed speech responses Cranial nerve II-XII: Pupils were equal round reactive to light. Extraocular movements were full, visual field were full on confrontational test. Facial sensation and strength were normal. Uvula tongue midline. Head turning and shoulder shrug  were normal and symmetric. Motor: The motor testing reveals 5 over 5 strength of all 4 extremities. Good symmetric motor tone is noted throughout.  Sensory: Sensory testing is intact to soft touch on all 4 extremities. No evidence of extinction is noted.  Coordination: Cerebellar testing reveals good finger-nose-finger and heel-to-shin bilaterally.  Gait and station: Gait is normal. Tandem gait is normal. Romberg is negative. No drift is seen.  Reflexes: Deep tendon reflexes are symmetric and normal bilaterally.   DIAGNOSTIC DATA (LABS,  IMAGING, TESTING) - I reviewed patient records, labs, notes, testing and imaging myself where available.  Lab Results  Component Value Date   WBC 3.7 (L) 03/25/2018   HGB 13.2 03/25/2018   HCT 40.2 03/25/2018   MCV 95.0 03/25/2018   PLT 269 03/25/2018      Component Value Date/Time   NA 141 10/18/2015 0635   K 4.2 10/18/2015 0635   CL 105 10/18/2015 0635   CO2 27 10/18/2015 0635   GLUCOSE 123 (H) 10/18/2015 0635   BUN 14 10/18/2015 0635   CREATININE 1.19 10/18/2015 0635   CALCIUM 9.6 10/18/2015 0635   PROT 7.5 10/18/2015 0635   ALBUMIN 4.4 10/18/2015 5956  AST 30 10/18/2015 0635   ALT 28 10/18/2015 0635   ALKPHOS 55 10/18/2015 0635   BILITOT 0.9 10/18/2015 0635   GFRNONAA >60 10/18/2015 0635   GFRAA >60 10/18/2015 0635   No results found for: CHOL, HDL, LDLCALC, LDLDIRECT, TRIG, CHOLHDL No results found for: HGBA1C Lab Results  Component Value Date   VITAMINB12 380 10/04/2018   Lab Results  Component Value Date   TSH 0.972 10/04/2018    MRI November 2019  IMPRESSION: This MRI of the brain with and without contrast shows the following: 1.    There is mild generalized cortical atrophy. 2.    Mild extent of T2/FLAIR hypertense foci in the hemispheres consistent with mild chronic microvascular ischemic change.  None of the foci appear to be acute. 3.    There are no acute findings.    ASSESSMENT AND PLAN 73 y.o. year old male   Mild Cognitive Impairment  Overall, memory is stable from last visit Laboratory evaluations in November 2019 were normal We personally reviewed in MRI November 2019 showed generalized atrophy, supratentorium small vessel disease, no acute abnormality MMSE 23/29 October 2018.  We discussed starting Namenda and Aricept. He is reluctant at this time. He wants to take some time to think about it and do his own research. They will contact us to send in the prescription if needed.  He will follow-up in 6 months or sooner if needed.   I  spent 15 minutes with the patient. 50% of this time was spent discussing his plan of care.   I saw patient with NP Judson Roch, agree above.  Butler Denmark, AGNP-C, DNP 01/03/2019, 8:22 AM [facility] [address], [place], [place] [address] [phone]

## 2019-01-04 ENCOUNTER — Ambulatory Visit: Payer: PPO | Admitting: Neurology

## 2019-01-09 DIAGNOSIS — G629 Polyneuropathy, unspecified: Secondary | ICD-10-CM | POA: Diagnosis not present

## 2019-01-09 DIAGNOSIS — I10 Essential (primary) hypertension: Secondary | ICD-10-CM | POA: Diagnosis not present

## 2019-01-09 DIAGNOSIS — K08409 Partial loss of teeth, unspecified cause, unspecified class: Secondary | ICD-10-CM | POA: Diagnosis not present

## 2019-01-09 DIAGNOSIS — D509 Iron deficiency anemia, unspecified: Secondary | ICD-10-CM | POA: Diagnosis not present

## 2019-01-09 DIAGNOSIS — Z6831 Body mass index (BMI) 31.0-31.9, adult: Secondary | ICD-10-CM | POA: Diagnosis not present

## 2019-01-09 DIAGNOSIS — J309 Allergic rhinitis, unspecified: Secondary | ICD-10-CM | POA: Diagnosis not present

## 2019-01-09 DIAGNOSIS — Z7982 Long term (current) use of aspirin: Secondary | ICD-10-CM | POA: Diagnosis not present

## 2019-01-09 DIAGNOSIS — E669 Obesity, unspecified: Secondary | ICD-10-CM | POA: Diagnosis not present

## 2019-02-28 DIAGNOSIS — I10 Essential (primary) hypertension: Secondary | ICD-10-CM | POA: Diagnosis not present

## 2019-02-28 DIAGNOSIS — I208 Other forms of angina pectoris: Secondary | ICD-10-CM | POA: Diagnosis not present

## 2019-02-28 DIAGNOSIS — E785 Hyperlipidemia, unspecified: Secondary | ICD-10-CM | POA: Diagnosis not present

## 2019-02-28 DIAGNOSIS — R7303 Prediabetes: Secondary | ICD-10-CM | POA: Diagnosis not present

## 2019-05-12 DIAGNOSIS — L408 Other psoriasis: Secondary | ICD-10-CM | POA: Diagnosis not present

## 2019-05-12 DIAGNOSIS — L404 Guttate psoriasis: Secondary | ICD-10-CM | POA: Diagnosis not present

## 2019-05-30 DIAGNOSIS — R7303 Prediabetes: Secondary | ICD-10-CM | POA: Diagnosis not present

## 2019-05-30 DIAGNOSIS — I10 Essential (primary) hypertension: Secondary | ICD-10-CM | POA: Diagnosis not present

## 2019-05-30 DIAGNOSIS — E785 Hyperlipidemia, unspecified: Secondary | ICD-10-CM | POA: Diagnosis not present

## 2019-05-30 DIAGNOSIS — I208 Other forms of angina pectoris: Secondary | ICD-10-CM | POA: Diagnosis not present

## 2019-07-05 ENCOUNTER — Telehealth: Payer: Self-pay | Admitting: Neurology

## 2019-07-05 ENCOUNTER — Ambulatory Visit: Payer: Medicare HMO | Admitting: Adult Health

## 2019-07-05 ENCOUNTER — Encounter: Payer: Self-pay | Admitting: Adult Health

## 2019-07-05 ENCOUNTER — Other Ambulatory Visit: Payer: Self-pay

## 2019-07-05 VITALS — BP 126/68 | HR 62 | Temp 97.5°F | Ht 74.0 in | Wt 207.0 lb

## 2019-07-05 DIAGNOSIS — R413 Other amnesia: Secondary | ICD-10-CM

## 2019-07-05 MED ORDER — DONEPEZIL HCL 5 MG PO TABS: 5.0000 mg | ORAL_TABLET | Freq: Every day | ORAL | 0 refills | Status: DC

## 2019-07-05 NOTE — Addendum Note (Signed)
Addended by: Trudie Buckler on: 07/05/2019 01:10 PM   Modules accepted: Orders

## 2019-07-05 NOTE — Telephone Encounter (Signed)
Called the pt to make aware that Dr Krista Blue will be out of the office today. There was a 9:30 opening with NP I have just moved apt to NP but wanted them to be aware.

## 2019-07-05 NOTE — Progress Notes (Signed)
I have reviewed and agreed above plan. 

## 2019-07-05 NOTE — Progress Notes (Signed)
PATIENT: Eric Tyler DOB: 13-Mar-1946  REASON FOR VISIT: follow up HISTORY FROM: patient  HISTORY OF PRESENT ILLNESS: Today 07/05/19:  Mr. Eric Tyler is a 73 year old male with a history of memory disturbance.  He returns today for follow-up.  He reports that his memory has remained stable.  He is able to complete all ADLs independently.  He operates a Teacher, music without difficulty.  He helps his wife with the finances.  Denies any trouble sleeping.  Wife notes that he seems to be more anxious than normal.  Denies any other significant changes with his mood or behavior.  He returns today for follow-up.   01/03/19  Mr. Eric Tyler is a 73 year old male who presents for follow-up today with his wife, Mae for memory impairment. He mostly reports trouble finding his words; no trouble with names. His wife does the bills, and it has always been that way. Sometimes when he is reading there will be a pause with his reading of words. His wife reports sometimes he gets frustrated when he is asked too many questions because he can't get the words out quick enough. He does have a stutter. He likes fishing. He is driving and doing well. He does have hearing loss and wears hearing aids. He is looking for a home phone that the volume can be increased. 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 He and his wife just returned just returned from a weekend trip to [place] where he drove most of the way. He presents today for follow-up.   HISTORY  Eric Knipple Smithis a 73 year old male, seen in request byhis primary care physician Dr.[name], Walterfor evaluation of memory loss, initial evaluation was on October 04, 2018. He is accompanied by his wife for 4 years.  I have reviewed and summarized the referring note from the referring physician.He has past medical history of hypertension, is a retired Furniture conservator/restorer, shortly after retirement, around age 65, he noticed mild word finding  difficulties, gradually getting worse, was recently noted by his wife it took him longer to answer back, also have some mood swings, he enjoys fishing, and travel, drives long distance,  Laboratory evaluations in September 2019, CBC showed hemoglobin of 13.4, normal CMP, LDL of 91, cholesterol of 148, normal PSA,  REVIEW OF SYSTEMS: Out of a complete 14 system review of symptoms, the patient complains only of the following symptoms, and all other reviewed systems are negative.  ALLERGIES: No Known Allergies  HOME MEDICATIONS: Outpatient Medications Prior to Visit  Medication Sig Dispense Refill  . Acetaminophen (TYLENOL 8 HOUR ARTHRITIS PAIN PO) Take 1 tablet by mouth 3 (three) times daily as needed.    Marland Kitchen aspirin EC 81 MG tablet Take 81 mg by mouth daily.    . cetirizine (ZYRTEC) 10 MG tablet Take 1 tablet (10 mg total) by mouth daily for 10 days. 10 tablet 3  . metoprolol succinate (TOPROL-XL) 25 MG 24 hr tablet 12.5 mg daily.  3  . Multiple Vitamin (MULTIVITAMIN WITH MINERALS) TABS tablet Take 1 tablet by mouth daily.    Marland Kitchen omega-3 acid ethyl esters (LOVAZA) 1 G capsule Take 1 g by mouth 2 (two) times daily.    . Salicylic Acid (SELSUN BLUE NATURALS) 3 % SHAM Apply to affected area and leave on for 30 minutes prior to shower 236 mL 0  . Triamcinolone Acetonide (TRIAMCINOLONE 0.1 % CREAM : EUCERIN) CREA Apply 1 application topically 3 (three) times daily. 50:50 mix---apply 5-7 days  tid 1 each 0   No facility-administered medications prior to visit.     PAST MEDICAL HISTORY: Past Medical History:  Diagnosis Date  . Allergy     PAST SURGICAL HISTORY: Past Surgical History:  Procedure Laterality Date  . FINGER SURGERY      FAMILY HISTORY: Family History  Problem Relation Age of Onset  . Hypertension Mother   . Hypertension Father   . Hyperlipidemia Father     SOCIAL HISTORY: Social History   Socioeconomic History  . Marital status: Married    Spouse name: [name]  .  Number of children: 8  . Years of education: 48  . Highest education level: Not on file  Occupational History    Comment: retired  Scientific laboratory technician  . Financial resource strain: Not on file  . Food insecurity    Worry: Not on file    Inability: Not on file  . Transportation needs    Medical: Not on file    Non-medical: Not on file  Tobacco Use  . Smoking status: Never Smoker  . Smokeless tobacco: Never Used  Substance and Sexual Activity  . Alcohol use: No    Alcohol/week: 0.0 standard drinks  . Drug use: No  . Sexual activity: Not on file  Lifestyle  . Physical activity    Days per week: Not on file    Minutes per session: Not on file  . Stress: Not on file  Relationships  . Social Herbalist on phone: Not on file    Gets together: Not on file    Attends religious service: Not on file    Active member of club or organization: Not on file    Attends meetings of clubs or organizations: Not on file    Relationship status: Not on file  . Intimate partner violence    Fear of current or ex partner: Not on file    Emotionally abused: Not on file    Physically abused: Not on file    Forced sexual activity: Not on file  Other Topics Concern  . Not on file  Social History Narrative   Lives with wife      PHYSICAL EXAM  Vitals:   07/05/19 0926  BP: 126/68  Pulse: 62  Temp: (!) 97.5 F (36.4 C)  Weight: 207 lb (93.9 kg)  Height: 6\' 2"  (1.88 m)   Body mass index is 26.58 kg/m.   MMSE - Mini Mental State Exam 07/05/2019 10/04/2018  Orientation to time 4 5  Orientation to Place 5 4  Registration 3 3  Attention/ Calculation 1 3  Recall 0 1  Language- name 2 objects 2 2  Language- repeat 1 0  Language- follow 3 step command 3 3  Language- read & follow direction 0 1  Write a sentence 1 1  Copy design 0 0  Copy design-comments named 6 animals -  Total score 20 23     Generalized: Well developed, in no acute distress   Neurological examination   Mentation: Alert oriented to time, place, history taking. Follows all commands speech and language fluent Cranial nerve II-XII:  Extraocular movements were full, visual field were full on confrontational test. Head turning and shoulder shrug  were normal and symmetric. Motor: The motor testing reveals 5 over 5 strength of all 4 extremities. Good symmetric motor tone is noted throughout.  Sensory: Sensory testing is intact to soft touch on all 4 extremities. No evidence of extinction is noted.  Coordination: Cerebellar testing reveals good finger-nose-finger and heel-to-shin bilaterally.  Gait and station: Gait is normal. Tandem gait is normal. Romberg is negative. No drift is seen.  Reflexes: Deep tendon reflexes are symmetric and normal bilaterally.   DIAGNOSTIC DATA (LABS, IMAGING, TESTING) - I reviewed patient records, labs, notes, testing and imaging myself where available.  Lab Results  Component Value Date   WBC 3.7 (L) 03/25/2018   HGB 13.2 03/25/2018   HCT 40.2 03/25/2018   MCV 95.0 03/25/2018   PLT 269 03/25/2018      Component Value Date/Time   NA 141 10/18/2015 0635   K 4.2 10/18/2015 0635   CL 105 10/18/2015 0635   CO2 27 10/18/2015 0635   GLUCOSE 123 (H) 10/18/2015 0635   BUN 14 10/18/2015 0635   CREATININE 1.19 10/18/2015 0635   CALCIUM 9.6 10/18/2015 0635   PROT 7.5 10/18/2015 0635   ALBUMIN 4.4 10/18/2015 0635   AST 30 10/18/2015 0635   ALT 28 10/18/2015 0635   ALKPHOS 55 10/18/2015 0635   BILITOT 0.9 10/18/2015 0635   GFRNONAA >60 10/18/2015 0635   GFRAA >60 10/18/2015 0635   No results found for: CHOL, HDL, LDLCALC, LDLDIRECT, TRIG, CHOLHDL No results found for: HGBA1C Lab Results  Component Value Date   VITAMINB12 380 10/04/2018   Lab Results  Component Value Date   TSH 0.972 10/04/2018      ASSESSMENT AND PLAN 73 y.o. year old male  has a past medical history of Allergy. here with:  1.  Memory disturbance  The patient's memory score has  declined slightly since last visit.  We discussed starting Aricept.  The patient is amenable.  I have reviewed potential side effects and gave him a handout reviewing the medication.  I have advised that if his symptoms worsen or he develops new symptoms he should let us know.  We will follow-up in 6 months or sooner if needed.  I spent 15 minutes with the patient. 50% of this time was spent reviewing memory score and medication   Ward Givens, MSN, NP-C 07/05/2019, 9:09 AM [facility] [address], [place], [place] [address] [phone]

## 2019-07-05 NOTE — Patient Instructions (Signed)
Start Aricept 5 mg at bedtime If your symptoms worsen or you develop new symptoms please let us know.    Donepezil tablets What is this medicine? DONEPEZIL (doe NEP e zil) is used to treat mild to moderate dementia caused by Alzheimer's disease. This medicine may be used for other purposes; ask your health care provider or pharmacist if you have questions. COMMON BRAND NAME(S): Aricept What should I tell my health care provider before I take this medicine? They need to know if you have any of these conditions:  asthma or other lung disease  difficulty passing urine  head injury  heart disease  history of irregular heartbeat  liver disease  seizures (convulsions)  stomach or intestinal disease, ulcers or stomach bleeding  an unusual or allergic reaction to donepezil, other medicines, foods, dyes, or preservatives  pregnant or trying to get pregnant  breast-feeding How should I use this medicine? Take this medicine by mouth with a glass of water. Follow the directions on the prescription label. You may take this medicine with or without food. Take this medicine at regular intervals. This medicine is usually taken before bedtime. Do not take it more often than directed. Continue to take your medicine even if you feel better. Do not stop taking except on your doctor's advice. If you are taking the 23 mg donepezil tablet, swallow it whole; do not cut, crush, or chew it. Talk to your pediatrician regarding the use of this medicine in children. Special care may be needed. Overdosage: If you think you have taken too much of this medicine contact a poison control center or emergency room at once. NOTE: This medicine is only for you. Do not share this medicine with others. What if I miss a dose? If you miss a dose, take it as soon as you can. If it is almost time for your next dose, take only that dose, do not take double or extra doses. What may interact with this medicine? Do not  take this medicine with any of the following medications:  certain medicines for fungal infections like itraconazole, fluconazole, posaconazole, and voriconazole  cisapride  dextromethorphan; quinidine  dronedarone  pimozide  quinidine  thioridazine This medicine may also interact with the following medications:  antihistamines for allergy, cough and cold  atropine  bethanechol  carbamazepine  certain medicines for bladder problems like oxybutynin, tolterodine  certain medicines for Parkinson's disease like benztropine, trihexyphenidyl  certain medicines for stomach problems like dicyclomine, hyoscyamine  certain medicines for travel sickness like scopolamine  dexamethasone  dofetilide  ipratropium  NSAIDs, medicines for pain and inflammation, like ibuprofen or naproxen  other medicines for Alzheimer's disease  other medicines that prolong the QT interval (cause an abnormal heart rhythm)  phenobarbital  phenytoin  rifampin, rifabutin or rifapentine  ziprasidone This list may not describe all possible interactions. Give your health care provider a list of all the medicines, herbs, non-prescription drugs, or dietary supplements you use. Also tell them if you smoke, drink alcohol, or use illegal drugs. Some items may interact with your medicine. What should I watch for while using this medicine? Visit your doctor or health care professional for regular checks on your progress. Check with your doctor or health care professional if your symptoms do not get better or if they get worse. You may get drowsy or dizzy. Do not drive, use machinery, or do anything that needs mental alertness until you know how this drug affects you. What side effects may I notice  from receiving this medicine? Side effects that you should report to your doctor or health care professional as soon as possible:  allergic reactions like skin rash, itching or hives, swelling of the face, lips,  or tongue  feeling faint or lightheaded, falls  loss of bladder control  seizures  signs and symptoms of a dangerous change in heartbeat or heart rhythm like chest pain; dizziness; fast or irregular heartbeat; palpitations; feeling faint or lightheaded, falls; breathing problems  signs and symptoms of infection like fever or chills; cough; sore throat; pain or trouble passing urine  signs and symptoms of liver injury like dark yellow or brown urine; general ill feeling or flu-like symptoms; light-colored stools; loss of appetite; nausea; right upper belly pain; unusually weak or tired; yellowing of the eyes or skin  slow heartbeat or palpitations  unusual bleeding or bruising  vomiting Side effects that usually do not require medical attention (report to your doctor or health care professional if they continue or are bothersome):  diarrhea, especially when starting treatment  headache  loss of appetite  muscle cramps  nausea  stomach upset This list may not describe all possible side effects. Call your doctor for medical advice about side effects. You may report side effects to FDA at 1-800-FDA-1088. Where should I keep my medicine? Keep out of reach of children. Store at room temperature between 15 and 30 degrees C (59 and 86 degrees F). Throw away any unused medicine after the expiration date. NOTE: This sheet is a summary. It may not cover all possible information. If you have questions about this medicine, talk to your doctor, pharmacist, or health care provider.  2020 Elsevier/Gold Standard (2018-11-07 10:33:41)

## 2019-07-25 DIAGNOSIS — L404 Guttate psoriasis: Secondary | ICD-10-CM | POA: Diagnosis not present

## 2019-07-29 DIAGNOSIS — Z03818 Encounter for observation for suspected exposure to other biological agents ruled out: Secondary | ICD-10-CM | POA: Diagnosis not present

## 2019-08-24 DIAGNOSIS — Z125 Encounter for screening for malignant neoplasm of prostate: Secondary | ICD-10-CM | POA: Diagnosis not present

## 2019-08-24 DIAGNOSIS — Z0000 Encounter for general adult medical examination without abnormal findings: Secondary | ICD-10-CM | POA: Diagnosis not present

## 2019-08-24 DIAGNOSIS — D72819 Decreased white blood cell count, unspecified: Secondary | ICD-10-CM | POA: Diagnosis not present

## 2019-08-29 DIAGNOSIS — E785 Hyperlipidemia, unspecified: Secondary | ICD-10-CM | POA: Diagnosis not present

## 2019-08-29 DIAGNOSIS — I2510 Atherosclerotic heart disease of native coronary artery without angina pectoris: Secondary | ICD-10-CM | POA: Diagnosis not present

## 2019-08-29 DIAGNOSIS — Z23 Encounter for immunization: Secondary | ICD-10-CM | POA: Diagnosis not present

## 2019-08-29 DIAGNOSIS — L309 Dermatitis, unspecified: Secondary | ICD-10-CM | POA: Diagnosis not present

## 2019-08-29 DIAGNOSIS — E079 Disorder of thyroid, unspecified: Secondary | ICD-10-CM | POA: Diagnosis not present

## 2019-08-29 DIAGNOSIS — D72819 Decreased white blood cell count, unspecified: Secondary | ICD-10-CM | POA: Diagnosis not present

## 2019-08-29 DIAGNOSIS — I208 Other forms of angina pectoris: Secondary | ICD-10-CM | POA: Diagnosis not present

## 2019-08-29 DIAGNOSIS — R413 Other amnesia: Secondary | ICD-10-CM | POA: Diagnosis not present

## 2019-08-29 DIAGNOSIS — Z0001 Encounter for general adult medical examination with abnormal findings: Secondary | ICD-10-CM | POA: Diagnosis not present

## 2019-08-29 DIAGNOSIS — I10 Essential (primary) hypertension: Secondary | ICD-10-CM | POA: Diagnosis not present

## 2019-08-29 DIAGNOSIS — E010 Iodine-deficiency related diffuse (endemic) goiter: Secondary | ICD-10-CM | POA: Diagnosis not present

## 2019-08-29 DIAGNOSIS — R7303 Prediabetes: Secondary | ICD-10-CM | POA: Diagnosis not present

## 2019-09-04 ENCOUNTER — Other Ambulatory Visit: Payer: Self-pay | Admitting: Neurology

## 2019-09-04 ENCOUNTER — Other Ambulatory Visit: Payer: Self-pay | Admitting: Internal Medicine

## 2019-09-04 DIAGNOSIS — E079 Disorder of thyroid, unspecified: Secondary | ICD-10-CM

## 2019-09-08 ENCOUNTER — Ambulatory Visit
Admission: RE | Admit: 2019-09-08 | Discharge: 2019-09-08 | Disposition: A | Discharge status: Home / Self Care | Payer: PPO | Source: Ambulatory Visit | Attending: Internal Medicine | Admitting: Internal Medicine

## 2019-09-08 DIAGNOSIS — E079 Disorder of thyroid, unspecified: Secondary | ICD-10-CM

## 2019-09-08 DIAGNOSIS — E041 Nontoxic single thyroid nodule: Secondary | ICD-10-CM | POA: Diagnosis not present

## 2019-09-14 DIAGNOSIS — Z7189 Other specified counseling: Secondary | ICD-10-CM | POA: Diagnosis not present

## 2019-09-14 DIAGNOSIS — Z6828 Body mass index (BMI) 28.0-28.9, adult: Secondary | ICD-10-CM | POA: Diagnosis not present

## 2019-09-14 DIAGNOSIS — E042 Nontoxic multinodular goiter: Secondary | ICD-10-CM | POA: Diagnosis not present

## 2019-09-26 DIAGNOSIS — Z79899 Other long term (current) drug therapy: Secondary | ICD-10-CM | POA: Diagnosis not present

## 2019-09-26 DIAGNOSIS — L404 Guttate psoriasis: Secondary | ICD-10-CM | POA: Diagnosis not present

## 2019-09-26 DIAGNOSIS — L400 Psoriasis vulgaris: Secondary | ICD-10-CM | POA: Diagnosis not present

## 2019-09-28 DIAGNOSIS — I2510 Atherosclerotic heart disease of native coronary artery without angina pectoris: Secondary | ICD-10-CM | POA: Diagnosis not present

## 2019-09-28 DIAGNOSIS — E7800 Pure hypercholesterolemia, unspecified: Secondary | ICD-10-CM | POA: Diagnosis not present

## 2019-09-28 DIAGNOSIS — R413 Other amnesia: Secondary | ICD-10-CM | POA: Diagnosis not present

## 2019-11-02 ENCOUNTER — Encounter (HOSPITAL_COMMUNITY): Payer: Self-pay | Admitting: Emergency Medicine

## 2019-11-02 ENCOUNTER — Other Ambulatory Visit: Payer: Self-pay

## 2019-11-02 ENCOUNTER — Ambulatory Visit (HOSPITAL_COMMUNITY)
Admission: EM | Admit: 2019-11-02 | Discharge: 2019-11-02 | Disposition: A | Discharge status: Home / Self Care | Payer: Medicare HMO | Attending: Family Medicine | Admitting: Family Medicine

## 2019-11-02 DIAGNOSIS — S39012A Strain of muscle, fascia and tendon of lower back, initial encounter: Secondary | ICD-10-CM

## 2019-11-02 MED ORDER — PREDNISONE 20 MG PO TABS: ORAL_TABLET | ORAL | 0 refills | Status: DC
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
## 2019-11-02 NOTE — Discharge Instructions (Addendum)
Take one prednisone daily for three days.  If pain worsens, return or go to emergency department.

## 2019-11-06 ENCOUNTER — Ambulatory Visit (HOSPITAL_COMMUNITY)
Admission: EM | Admit: 2019-11-06 | Discharge: 2019-11-06 | Disposition: A | Discharge status: Home / Self Care | Payer: Medicare HMO

## 2019-11-06 ENCOUNTER — Other Ambulatory Visit: Payer: Self-pay

## 2019-11-06 ENCOUNTER — Encounter (HOSPITAL_COMMUNITY): Payer: Self-pay

## 2019-11-06 DIAGNOSIS — S39012D Strain of muscle, fascia and tendon of lower back, subsequent encounter: Secondary | ICD-10-CM | POA: Diagnosis not present
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
## 2019-11-06 NOTE — Discharge Instructions (Signed)
Light and regular activity as tolerated.  Heat application while active can help with muscle spasms.  Sleep with pillow under your knees.   Continue with tylenol every 8 hours as needed for pain.  Please follow up with your primary care provider for re-check in the next few weeks. Return if worsening.
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
## 2019-11-13 DIAGNOSIS — E7800 Pure hypercholesterolemia, unspecified: Secondary | ICD-10-CM | POA: Diagnosis not present

## 2019-11-13 DIAGNOSIS — R413 Other amnesia: Secondary | ICD-10-CM | POA: Diagnosis not present

## 2019-11-13 DIAGNOSIS — I2510 Atherosclerotic heart disease of native coronary artery without angina pectoris: Secondary | ICD-10-CM | POA: Diagnosis not present

## 2019-11-14 DIAGNOSIS — L400 Psoriasis vulgaris: Secondary | ICD-10-CM | POA: Diagnosis not present

## 2019-11-20 ENCOUNTER — Encounter (HOSPITAL_COMMUNITY): Payer: Self-pay | Admitting: Emergency Medicine

## 2019-11-20 ENCOUNTER — Ambulatory Visit (HOSPITAL_COMMUNITY)
Admission: EM | Admit: 2019-11-20 | Discharge: 2019-11-20 | Disposition: A | Discharge status: Home / Self Care | Payer: Medicare HMO | Attending: Family Medicine | Admitting: Family Medicine

## 2019-11-20 ENCOUNTER — Other Ambulatory Visit: Payer: Self-pay

## 2019-11-20 DIAGNOSIS — S39012D Strain of muscle, fascia and tendon of lower back, subsequent encounter: Secondary | ICD-10-CM
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
## 2019-11-30 ENCOUNTER — Other Ambulatory Visit: Payer: Self-pay

## 2019-11-30 DIAGNOSIS — Z20828 Contact with and (suspected) exposure to other viral communicable diseases: Secondary | ICD-10-CM | POA: Diagnosis not present

## 2019-11-30 DIAGNOSIS — Z20822 Contact with and (suspected) exposure to covid-19: Secondary | ICD-10-CM

## 2019-12-02 LAB — NOVEL CORONAVIRUS, NAA: SARS-CoV-2, NAA: NOT DETECTED

## 2019-12-18 DIAGNOSIS — D485 Neoplasm of uncertain behavior of skin: Secondary | ICD-10-CM | POA: Diagnosis not present

## 2019-12-18 DIAGNOSIS — L400 Psoriasis vulgaris: Secondary | ICD-10-CM | POA: Diagnosis not present

## 2020-01-08 ENCOUNTER — Ambulatory Visit: Payer: Medicare HMO | Admitting: Neurology

## 2020-01-08 ENCOUNTER — Other Ambulatory Visit: Payer: Self-pay

## 2020-01-08 ENCOUNTER — Encounter: Payer: Self-pay | Admitting: Neurology

## 2020-01-08 VITALS — BP 138/83 | HR 66 | Temp 96.6°F | Ht 74.0 in | Wt 212.8 lb

## 2020-01-08 DIAGNOSIS — G3184 Mild cognitive impairment, so stated: Secondary | ICD-10-CM | POA: Diagnosis not present

## 2020-01-08 MED ORDER — DONEPEZIL HCL 10 MG PO TABS: 10.0000 mg | ORAL_TABLET | Freq: Every day | ORAL | 5 refills | Status: DC

## 2020-01-08 NOTE — Progress Notes (Signed)
PATIENT: Eric Tyler DOB: 1946/04/01  REASON FOR VISIT: follow up HISTORY FROM: patient  HISTORY OF PRESENT ILLNESS: Today 01/08/20  HISTORY  HISTORY Eric Albers Smithis a 74 year old male, seen in request byhis primary care physician Dr.[name], Walterfor evaluation of memory loss, initial evaluation was on October 04, 2018. He is accompanied by his wife for 4 years.  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  He returns today for follow-up.  He reports that his memory has remained stable.  He is able to complete all ADLs independently.  He operates a Teacher, music without difficulty.  He helps his wife with the finances.  Denies any trouble sleeping.  Wife notes that he seems to be more anxious than normal.  Denies any other significant changes with his mood or behavior.  He returns today for follow-up.  Update 01/09/2020 SS: Eric Tyler is a 74 year old male with history of memory disturbance.  He and his wife feel his memory has remained stable.  He has trouble with short-term memory.  He remains very active, enjoys fishing, working in the yard, working on cars.  He drives a car without difficulty, does not get lost.  He has a good appetite.  He has psoriasis, the rash on his legs, itches at night, sometimes making him restless.  He has not had any falls.  He is hard of hearing, wears hearing aids.  He has a speech  impediment, he stutters.  He never started Aricept.  He presents today for follow-up accompanied by his wife.  REVIEW OF SYSTEMS: Out of a complete 14 system review of symptoms, the patient complains only of the following symptoms, and all other reviewed systems are negative.  Memory loss  ALLERGIES: No Known Allergies  HOME MEDICATIONS: Outpatient Medications Prior to Visit  Medication Sig Dispense Refill  . Acetaminophen (TYLENOL 8 HOUR ARTHRITIS PAIN PO) Take 1 tablet by mouth 3 (three) times daily as needed.    Marland Kitchen aspirin EC 81 MG tablet Take 81 mg by mouth daily.    Marland Kitchen donepezil (ARICEPT) 5 MG tablet Take 1 tablet (5 mg total) by mouth at bedtime. 30 tablet 0  . Multiple Vitamin (MULTIVITAMIN WITH MINERALS) TABS tablet Take 1 tablet by mouth daily.    Marland Kitchen omega-3 acid ethyl esters (LOVAZA) 1 G capsule Take 1 g by mouth 2 (two) times daily.    . Risankizumab-rzaa,150 MG Dose, (SKYRIZI, 150 MG DOSE,) 75 MG/0.83ML PSKT Inject into the skin. Prescribed by a different provider    . cetirizine (ZYRTEC) 10 MG tablet Take 1 tablet (10 mg total) by mouth daily for 10 days. 10 tablet 3   No facility-administered medications prior to visit.    PAST MEDICAL HISTORY: Past Medical History:  Diagnosis Date  . Allergy     PAST SURGICAL HISTORY: Past Surgical History:  Procedure Laterality Date  . FINGER SURGERY      FAMILY HISTORY: Family History  Problem Relation Age  of Onset  . Hypertension Mother   . Hypertension Father   . Hyperlipidemia Father     SOCIAL HISTORY: Social History   Socioeconomic History  . Marital status: Married    Spouse name: [name]  . Number of children: 8  . Years of education: 51  . Highest education level: Not on file  Occupational History    Comment: retired  Tobacco Use  . Smoking status: Never Smoker  . Smokeless tobacco: Never Used  Substance and Sexual Activity  . Alcohol use: No    Alcohol/week: 0.0 standard drinks  . Drug use: No  . Sexual  activity: Not on file  Other Topics Concern  . Not on file  Social History Narrative   Lives with wife   Social Determinants of Health   Financial Resource Strain:   . Difficulty of Paying Living Expenses: Not on file  Food Insecurity:   . Worried About Charity fundraiser in the Last Year: Not on file  . Ran Out of Food in the Last Year: Not on file  Transportation Needs:   . Lack of Transportation (Medical): Not on file  . Lack of Transportation (Non-Medical): Not on file  Physical Activity:   . Days of Exercise per Week: Not on file  . Minutes of Exercise per Session: Not on file  Stress:   . Feeling of Stress : Not on file  Social Connections:   . Frequency of Communication with Friends and Family: Not on file  . Frequency of Social Gatherings with Friends and Family: Not on file  . Attends Religious Services: Not on file  . Active Member of Clubs or Organizations: Not on file  . Attends Archivist Meetings: Not on file  . Marital Status: Not on file  Intimate Partner Violence:   . Fear of Current or Ex-Partner: Not on file  . Emotionally Abused: Not on file  . Physically Abused: Not on file  . Sexually Abused: Not on file      PHYSICAL EXAM  Vitals:   01/08/20 0922  BP: 138/83  Pulse: 66  Temp: (!) 96.6 F (35.9 C)  Weight: 212 lb 12.8 oz (96.5 kg)  Height: 6\' 2"  (1.88 m)   Body mass index is 27.32 kg/m.  Generalized: Well developed, in no acute distress  MMSE - Mini Mental State Exam 01/08/2020 07/05/2019 10/04/2018  Orientation to time 3 4 5   Orientation to Place 4 5 4   Registration 3 3 3   Attention/ Calculation 1 1 3   Recall 1 0 1  Language- name 2 objects 1 2 2   Language- repeat 1 1 0  Language- follow 3 step command 3 3 3   Language- read & follow direction 0 0 1  Write a sentence 0 1 1  Copy design 1 0 0  Copy design-comments named 8 animals named 6 animals -  Total score 18 20 23     Neurological examination  Mentation: Alert,  oriented, hard of hearing, most of history is provided by his wife. Follows all commands speech and language fluent, stuttering at times Cranial nerve II-XII: Pupils were equal round reactive to light. Extraocular movements were full, visual field were full on confrontational test. Facial sensation and strength were normal. Head turning and shoulder shrug  were normal and symmetric. Motor: The motor testing reveals 5 over 5 strength of all 4 extremities. Good symmetric motor tone is noted throughout.  Sensory: Sensory testing is intact to soft touch on all 4  extremities. No evidence of extinction is noted.  Coordination: Cerebellar testing reveals good finger-nose-finger and heel-to-shin bilaterally.  Gait and station: Gait is normal.  Reflexes: Deep tendon reflexes are symmetric and normal bilaterally.   DIAGNOSTIC DATA (LABS, IMAGING, TESTING) - I reviewed patient records, labs, notes, testing and imaging myself where available.  Lab Results  Component Value Date   WBC 3.7 (L) 03/25/2018   HGB 13.2 03/25/2018   HCT 40.2 03/25/2018   MCV 95.0 03/25/2018   PLT 269 03/25/2018      Component Value Date/Time   NA 141 10/18/2015 0635   K 4.2 10/18/2015 0635   CL 105 10/18/2015 0635   CO2 27 10/18/2015 0635   GLUCOSE 123 (H) 10/18/2015 0635   BUN 14 10/18/2015 0635   CREATININE 1.19 10/18/2015 0635   CALCIUM 9.6 10/18/2015 0635   PROT 7.5 10/18/2015 0635   ALBUMIN 4.4 10/18/2015 0635   AST 30 10/18/2015 0635   ALT 28 10/18/2015 0635   ALKPHOS 55 10/18/2015 0635   BILITOT 0.9 10/18/2015 0635   GFRNONAA >60 10/18/2015 0635   GFRAA >60 10/18/2015 0635   No results found for: CHOL, HDL, LDLCALC, LDLDIRECT, TRIG, CHOLHDL No results found for: HGBA1C Lab Results  Component Value Date   VITAMINB12 380 10/04/2018   Lab Results  Component Value Date   TSH 0.972 10/04/2018   ASSESSMENT AND PLAN 74 y.o. year old male  has a past medical history of Allergy. here with:  1.  Mild  cognitive impairment -Memory score has declined slightly, 18/30, but he and his wife feel memory has remained stable, he remains very active, highly functional -I will start Aricept 10 mg at bedtime, we discussed side effects -MRI of the brain in November 2019 showed generalized atrophy, supratentorial small vessel disease -He will follow-up in 6 months or sooner if needed  I spent 15 minutes with the patient. 50% of this time was spent discussing his plan of care.  Butler Denmark, AGNP-C, DNP 01/08/2020, 9:32 AM [facility] [address], [place], [place] [address] [phone]

## 2020-01-08 NOTE — Patient Instructions (Addendum)
Let's start the Aricept 10 mg at bedtime, See you back in 6 months   Donepezil tablets What is this medicine? DONEPEZIL (doe NEP e zil) is used to treat mild to moderate dementia caused by Alzheimer's disease. This medicine may be used for other purposes; ask your health care provider or pharmacist if you have questions. COMMON BRAND NAME(S): Aricept What should I tell my health care provider before I take this medicine? They need to know if you have any of these conditions:  asthma or other lung disease  difficulty passing urine  head injury  heart disease  history of irregular heartbeat  liver disease  seizures (convulsions)  stomach or intestinal disease, ulcers or stomach bleeding  an unusual or allergic reaction to donepezil, other medicines, foods, dyes, or preservatives  pregnant or trying to get pregnant  breast-feeding How should I use this medicine? Take this medicine by mouth with a glass of water. Follow the directions on the prescription label. You may take this medicine with or without food. Take this medicine at regular intervals. This medicine is usually taken before bedtime. Do not take it more often than directed. Continue to take your medicine even if you feel better. Do not stop taking except on your doctor's advice. If you are taking the 23 mg donepezil tablet, swallow it whole; do not cut, crush, or chew it. Talk to your pediatrician regarding the use of this medicine in children. Special care may be needed. Overdosage: If you think you have taken too much of this medicine contact a poison control center or emergency room at once. NOTE: This medicine is only for you. Do not share this medicine with others. What if I miss a dose? If you miss a dose, take it as soon as you can. If it is almost time for your next dose, take only that dose, do not take double or extra doses. What may interact with this medicine? Do not take this medicine with any of the  following medications:  certain medicines for fungal infections like itraconazole, fluconazole, posaconazole, and voriconazole  cisapride  dextromethorphan; quinidine  dronedarone  pimozide  quinidine  thioridazine This medicine may also interact with the following medications:  antihistamines for allergy, cough and cold  atropine  bethanechol  carbamazepine  certain medicines for bladder problems like oxybutynin, tolterodine  certain medicines for Parkinson's disease like benztropine, trihexyphenidyl  certain medicines for stomach problems like dicyclomine, hyoscyamine  certain medicines for travel sickness like scopolamine  dexamethasone  dofetilide  ipratropium  NSAIDs, medicines for pain and inflammation, like ibuprofen or naproxen  other medicines for Alzheimer's disease  other medicines that prolong the QT interval (cause an abnormal heart rhythm)  phenobarbital  phenytoin  rifampin, rifabutin or rifapentine  ziprasidone This list may not describe all possible interactions. Give your health care provider a list of all the medicines, herbs, non-prescription drugs, or dietary supplements you use. Also tell them if you smoke, drink alcohol, or use illegal drugs. Some items may interact with your medicine. What should I watch for while using this medicine? Visit your doctor or health care professional for regular checks on your progress. Check with your doctor or health care professional if your symptoms do not get better or if they get worse. You may get drowsy or dizzy. Do not drive, use machinery, or do anything that needs mental alertness until you know how this drug affects you. What side effects may I notice from receiving this medicine? Side effects  that you should report to your doctor or health care professional as soon as possible:  allergic reactions like skin rash, itching or hives, swelling of the face, lips, or tongue  feeling faint or  lightheaded, falls  loss of bladder control  seizures  signs and symptoms of a dangerous change in heartbeat or heart rhythm like chest pain; dizziness; fast or irregular heartbeat; palpitations; feeling faint or lightheaded, falls; breathing problems  signs and symptoms of infection like fever or chills; cough; sore throat; pain or trouble passing urine  signs and symptoms of liver injury like dark yellow or brown urine; general ill feeling or flu-like symptoms; light-colored stools; loss of appetite; nausea; right upper belly pain; unusually weak or tired; yellowing of the eyes or skin  slow heartbeat or palpitations  unusual bleeding or bruising  vomiting Side effects that usually do not require medical attention (report to your doctor or health care professional if they continue or are bothersome):  diarrhea, especially when starting treatment  headache  loss of appetite  muscle cramps  nausea  stomach upset This list may not describe all possible side effects. Call your doctor for medical advice about side effects. You may report side effects to FDA at 1-800-FDA-1088. Where should I keep my medicine? Keep out of reach of children. Store at room temperature between 15 and 30 degrees C (59 and 86 degrees F). Throw away any unused medicine after the expiration date. NOTE: This sheet is a summary. It may not cover all possible information. If you have questions about this medicine, talk to your doctor, pharmacist, or health care provider.  2020 Elsevier/Gold Standard (2018-11-07 10:33:41)

## 2020-01-31 NOTE — Progress Notes (Signed)
I have reviewed and agreed above plan. 

## 2020-02-04 ENCOUNTER — Ambulatory Visit: Payer: Medicare HMO | Attending: Internal Medicine

## 2020-02-04 DIAGNOSIS — Z23 Encounter for immunization: Secondary | ICD-10-CM | POA: Insufficient documentation

## 2020-02-04 NOTE — Progress Notes (Signed)
   Covid-19 Vaccination Clinic  Name:  KHYLAR KEPPLE    MRN: [id number] DOB: 1946-02-23  02/04/2020  Mr. Crafts was observed post Covid-19 immunization for 15 minutes without incident. He was provided with Vaccine Information Sheet and instruction to access the V-Safe system.   Mr. Baab was instructed to call 911 with any severe reactions post vaccine: Marland Kitchen Difficulty breathing  . Swelling of face and throat  . A fast heartbeat  . A bad rash all over body  . Dizziness and weakness   Immunizations Administered    Name Date Dose VIS Date Route   Pfizer COVID-19 Vaccine 02/04/2020 11:58 AM 0.3 mL 11/10/2019 Intramuscular   Manufacturer: [place]   Lot: [id number]   [place]: [id number]

## 2020-03-05 ENCOUNTER — Ambulatory Visit: Payer: Medicare HMO | Attending: Internal Medicine

## 2020-03-05 DIAGNOSIS — H2513 Age-related nuclear cataract, bilateral: Secondary | ICD-10-CM | POA: Diagnosis not present

## 2020-03-05 DIAGNOSIS — Z23 Encounter for immunization: Secondary | ICD-10-CM

## 2020-03-05 DIAGNOSIS — H52223 Regular astigmatism, bilateral: Secondary | ICD-10-CM | POA: Diagnosis not present

## 2020-03-05 NOTE — Progress Notes (Signed)
   Covid-19 Vaccination Clinic  Name:  Eric Tyler    MRN: [id number] DOB: 1946/07/07  03/05/2020  [name] was observed post Covid-19 immunization for 15 minutes without incident. He was provided with Vaccine Information Sheet and instruction to access the V-Safe system.   [name] was instructed to call 911 with any severe reactions post vaccine: Marland Kitchen Difficulty breathing  . Swelling of face and throat  . A fast heartbeat  . A bad rash all over body  . Dizziness and weakness   Immunizations Administered    Name Date Dose VIS Date Route   Pfizer COVID-19 Vaccine 03/05/2020 11:46 AM 0.3 mL 11/10/2019 Intramuscular   Manufacturer: [place]   Lot: [id number]   [place]: [id number]      Covid-19 Vaccination Clinic  Name:  Eric Tyler    MRN: [id number] DOB: 31-Mar-1946  03/05/2020  [name] was observed post Covid-19 immunization for 15 minutes without incident. He was provided with Vaccine Information Sheet and instruction to access the V-Safe system.   [name] was instructed to call 911 with any severe reactions post vaccine: Marland Kitchen Difficulty breathing  . Swelling of face and throat  . A fast heartbeat  . A bad rash all over body  . Dizziness and weakness   Immunizations Administered    Name Date Dose VIS Date Route   Pfizer COVID-19 Vaccine 03/05/2020 11:46 AM 0.3 mL 11/10/2019 Intramuscular   Manufacturer: [place]   Lot: [id number]   [place]: [id number]

## 2020-03-14 DIAGNOSIS — L309 Dermatitis, unspecified: Secondary | ICD-10-CM | POA: Diagnosis not present

## 2020-03-14 DIAGNOSIS — I10 Essential (primary) hypertension: Secondary | ICD-10-CM | POA: Diagnosis not present

## 2020-03-14 DIAGNOSIS — E785 Hyperlipidemia, unspecified: Secondary | ICD-10-CM | POA: Diagnosis not present

## 2020-03-14 DIAGNOSIS — I208 Other forms of angina pectoris: Secondary | ICD-10-CM | POA: Diagnosis not present

## 2020-03-14 DIAGNOSIS — R7303 Prediabetes: Secondary | ICD-10-CM | POA: Diagnosis not present

## 2020-03-15 DIAGNOSIS — R600 Localized edema: Secondary | ICD-10-CM | POA: Diagnosis not present

## 2020-03-18 DIAGNOSIS — R600 Localized edema: Secondary | ICD-10-CM | POA: Diagnosis not present

## 2020-04-02 DIAGNOSIS — I2510 Atherosclerotic heart disease of native coronary artery without angina pectoris: Secondary | ICD-10-CM | POA: Diagnosis not present

## 2020-04-02 DIAGNOSIS — R0989 Other specified symptoms and signs involving the circulatory and respiratory systems: Secondary | ICD-10-CM | POA: Diagnosis not present

## 2020-04-02 DIAGNOSIS — D72819 Decreased white blood cell count, unspecified: Secondary | ICD-10-CM | POA: Diagnosis not present

## 2020-04-02 DIAGNOSIS — R0600 Dyspnea, unspecified: Secondary | ICD-10-CM | POA: Diagnosis not present

## 2020-04-25 DIAGNOSIS — L409 Psoriasis, unspecified: Secondary | ICD-10-CM | POA: Diagnosis not present

## 2020-04-25 DIAGNOSIS — R21 Rash and other nonspecific skin eruption: Secondary | ICD-10-CM | POA: Diagnosis not present

## 2020-04-25 DIAGNOSIS — D485 Neoplasm of uncertain behavior of skin: Secondary | ICD-10-CM | POA: Diagnosis not present

## 2020-05-01 ENCOUNTER — Telehealth: Payer: Self-pay | Admitting: Cardiology

## 2020-05-01 NOTE — Telephone Encounter (Signed)
   Pt's wife requesting to come in with pt to his appt 05/02/20. She said she needs to be there to assist pt since he is hard of hearing.Marland Kitchen

## 2020-05-01 NOTE — Telephone Encounter (Signed)
Attempt to return call-no answer and unable to leave VM.     Ok to assist patient to appointment.

## 2020-05-02 ENCOUNTER — Other Ambulatory Visit: Payer: Self-pay

## 2020-05-02 ENCOUNTER — Ambulatory Visit: Payer: Medicare HMO | Admitting: Cardiology

## 2020-05-02 ENCOUNTER — Encounter: Payer: Self-pay | Admitting: Cardiology

## 2020-05-02 DIAGNOSIS — R9439 Abnormal result of other cardiovascular function study: Secondary | ICD-10-CM | POA: Diagnosis not present

## 2020-05-02 DIAGNOSIS — R601 Generalized edema: Secondary | ICD-10-CM | POA: Diagnosis not present

## 2020-05-02 DIAGNOSIS — R609 Edema, unspecified: Secondary | ICD-10-CM | POA: Insufficient documentation

## 2020-05-02 NOTE — Progress Notes (Signed)
Primary Care Provider: [name], MD Cardiologist: Dr. Terrence Dupont -> last documented visit was September 2020 (unfortunately unable to see notes) Electrophysiologist: None  Clinic Note: Chief Complaint  Patient presents with  . New Patient (Initial Visit)    Questionable CHF and CAD    HPI:    Eric Tyler is a 74 y.o. male who is being seen today for the evaluation of DYSPNEA ON EXERTION-CONCERN FOR CHF and "CORONARY ARTERY DISEASE WITHOUT ANGINA PECTORIS".  At the request of [name], MD.  Eric Tyler was last seen on Apr 02, 2020 by Dr. Shelia Media for routine follow-up.  Interestingly, but no does not mention any about shortness of breath or chest pain.  However the assessment plan has referral to cardiology with echocardiogram ordered for management of CAD and questionable new CHF.--There was questionable swelling in his hands and feet noted on the previous visit in April 2021, and notable exertional dyspnea  Recent Hospitalizations: None since lumbar strain ER visits in December 2020.  Reviewed  CV studies:    The following studies were reviewed today: (if available, images/films reviewed: From Epic Chart or Care Everywhere) . 09/05/2018-Myoview: EF 48% with global HK.  Mild reversibility involving the mid anteroseptal wall suspicious for ischemia.  Intermediate risk.   Interval History:   Eric Tyler is here today with his wife and they are both relatively diffuse because he does have of primary cardiologist in whom they are relatively pleased.  The wife actually still is a patient of Dr. Terrence Dupont, and there has not been any issues to suggest loss of confidence.  Eric Tyler himself is not a very good historian.  Much of the history is provided by his wife.  He does answer some questions but not that much. They indicate that he has had some swelling in his hands and feet.  Apparently this seems to have gotten better a medication was stopped.  He was added.  Did not  understand sure what was on differently.  He enjoys trying to walk on the trails and may get some shortness of breath if he walks up a hill or incline, but denies any chest pain or pressure.  No PND orthopnea or edema.  CV Review of Symptoms (Summary) Cardiovascular ROS: positive for - dyspnea on exertion and edema negative for - chest pain, irregular heartbeat, orthopnea, palpitations, paroxysmal nocturnal dyspnea, rapid heart rate, shortness of breath or Syncope/near syncope, TIA/amaurosis fugax, claudication  The patient does not have symptoms concerning for COVID-19 infection (fever, chills, cough, or new shortness of breath).  The patient is practicing social distancing & Masking.   COVID-19 vaccines done February 04, 2020 & March 05, 2020  REVIEWED OF SYSTEMS   Review of Systems  Constitutional: Negative for malaise/fatigue and weight loss.  HENT: Negative for congestion and nosebleeds.   Respiratory: Negative for shortness of breath.   Gastrointestinal: Negative for abdominal pain, blood in stool and melena.  Genitourinary: Negative for dysuria and hematuria.  Musculoskeletal: Positive for joint pain.  Neurological: Negative for dizziness and headaches.  Psychiatric/Behavioral: Positive for memory loss. Negative for depression. The patient is not nervous/anxious and does not have insomnia.    I have reviewed and (if needed) personally updated the patient's problem list, medications, allergies, past medical and surgical history, social and family history.   PAST MEDICAL HISTORY   Past Medical History:  Diagnosis Date  . Allergy   . Hyperlipidemia   . Memory loss   . Prediabetes  PAST SURGICAL HISTORY   Past Surgical History:  Procedure Laterality Date  . FINGER SURGERY     Right thumb  . NM MYOVIEW LTD  08/2018   Myoview: EF 48% with global HK.  Mild reversibility involving the mid anteroseptal wall suspicious for ischemia.  Intermediate risk.     MEDICATIONS/ALLERGIES   Current Meds  Medication Sig  . Acetaminophen (TYLENOL 8 HOUR ARTHRITIS PAIN PO) Take 1 tablet by mouth 3 (three) times daily as needed.  Marland Kitchen aspirin EC 81 MG tablet Take 81 mg by mouth daily.  . cetirizine (ZYRTEC) 10 MG tablet Take 1 tablet (10 mg total) by mouth daily for 10 days.  Marland Kitchen donepezil (ARICEPT) 10 MG tablet Take 1 tablet (10 mg total) by mouth at bedtime.  . Multiple Vitamin (MULTIVITAMIN WITH MINERALS) TABS tablet Take 1 tablet by mouth daily.  Marland Kitchen omega-3 acid ethyl esters (LOVAZA) 1 G capsule Take 1 g by mouth 2 (two) times daily.  . Risankizumab-rzaa,150 MG Dose, (SKYRIZI, 150 MG DOSE,) 75 MG/0.83ML PSKT Inject into the skin. Prescribed by a different provider  Is taking Toprol 25 mg daily and Crestor 5 mg daily (not listed)  No Known Allergies  SOCIAL HISTORY/FAMILY HISTORY   Social History   Tobacco Use  . Smoking status: Never Smoker  . Smokeless tobacco: Never Used  Substance Use Topics  . Alcohol use: No    Alcohol/week: 0.0 standard drinks  . Drug use: No   Social History   Social History Narrative   Lives with wife.  He has a living will with attorney Eric Tyler).      He moved to [place] from [place] in 2019.  He has been seen by Dr. 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   Family History  Problem Relation Age of Onset  . Hypertension Mother   . Hypertension Father   . Hyperlipidemia Father      OBJCTIVE -PE, EKG, labs   Wt Readings from Last 3 Encounters:  05/02/20 198 lb 12.8 oz (90.2 kg)  01/08/20 212 lb 12.8 oz (96.5 kg)  11/06/19 206 lb (93.4 kg)    Physical Exam: BP 138/82   Pulse 63   Ht 6\' 2"  (1.88 m)   Wt 198 lb 12.8 oz (90.2 kg)   BMI 25.52 kg/m  Physical Exam  Constitutional: He is oriented to person, place, and time. He appears well-developed and well-nourished. No distress.  Healthy-appearing.   Well-groomed.  HENT:  Head: Normocephalic and atraumatic.  Neck: No hepatojugular reflux and no JVD present. Carotid bruit is not present.  Cardiovascular: Normal rate, regular rhythm and intact distal pulses.  No extrasystoles are present. PMI is not displaced. Exam reveals no gallop and no friction rub.  Murmur (Cannot exclude soft SEM at RUSB) heard. Pulmonary/Chest: Effort normal and breath sounds normal. No respiratory distress. He has no wheezes.  Abdominal: Soft. Bowel sounds are normal. He exhibits no distension. There is no abdominal tenderness. There is no rebound.  Musculoskeletal:        General: Edema (Mild diffuse pedal edema.) present. Normal range of motion.     Cervical back: Normal range of motion and neck supple.  Neurological: He is alert and oriented to person, place, and time.  Poor historian.  Much of the history is provided by wife.  He can barely answers questions.  Psychiatric:  Somewhat blunted mood and affect.  Does not answer most questions.  Very timid  Vitals reviewed.   Adult ECG Report  Rate: 63;  Rhythm: normal sinus rhythm and Normal axis, normal durations;   Narrative Interpretation: Normal EKG  Recent Labs: 03/15/2020: CBC-W3.9, H/H 13.2/40.9.  Platelet 297.  Na+ 143, K+ 4.9, Cl- 105, HCO3-26, BUN 6, Cr 1.15, Glu 97, Ca2+ 9.8; AST 29, ALT 12, AlkP 69  No results found for: CHOL, HDL, LDLCALC, LDLDIRECT, TRIG, CHOLHDL Lab Results  Component Value Date   CREATININE 1.19 10/18/2015   BUN 14 10/18/2015   NA 141 10/18/2015   K 4.2 10/18/2015   CL 105 10/18/2015   CO2 27 10/18/2015   Lab Results  Component Value Date   TSH 0.972 10/04/2018    ASSESSMENT/PLAN    Problem List Items Addressed This Visit    Abnormal nuclear stress test (Chronic)   Relevant Orders   EKG 12-Lead (Completed)   ECHOCARDIOGRAM COMPLETE   Edema   Relevant Orders   EKG 12-Lead (Completed)   ECHOCARDIOGRAM COMPLETE      I am a little perplexed with Mr. Derricks  and the reason for him being deferred.  He does appear some exertional dyspnea and has been eating okay I agree with checking a 2D echocardiogram with Covid order.  His EKG was relatively normal, he is not having any anginal symptoms.  He has less diagnosis of coronary artery disease, there is no documentation of possible abnormal Myoview.  No further dilation was done after the Myoview in 2019.  He is on a beta-blocker and statin according to Dr. Pennie Banter notes, but not placed on medications here.  Need to confirm. Blood pressure is borderline elevated, but is relatively well controlled at PCPs office  Plan for now we will check 2D echocardiogram and see in follow-up if this is indeed abnormal.  I did have a talk with the patient and his wife about their desires for further cardiology care they indicated that they were happy with Dr. Terrence Dupont and have no real desire to change because she is still seeing him.  Could consider diuretic, but he seems relatively asymptomatic with no PND or orthopnea.  Last echocardiogram is abnormal, I think he can just follow-up with his PCP and Dr. Terrence Dupont..  I did indicate, that for him to require invasive evaluation (I did not get a sense that this was an interest of his) that I would be happy to assist interventional cardiology consultation.   COVID-19 Education: The signs and symptoms of COVID-19 were discussed with the patient and how to seek care for testing (follow up with PCP or arrange E-visit).   The importance of social distancing and COVID-19 vaccination was discussed today.  I spent a total of 30 minutes with the patient. >  50% of the time was spent in direct patient consultation.  Additional time spent with chart review  / charting (studies, outside notes, etc): 15 -> Outside records reviewed, attempted to get notes from Dr. Terrence Dupont. Total Time: 45 min   Current medicines are reviewed at length with the patient today.  (+/- concerns) none  Notice: This  dictation was prepared with Dragon dictation along with smaller phrase technology. Any transcriptional errors that result from this process are unintentional and may not be corrected upon review.  Patient Instructions / Medication Changes & Studies & Tests Ordered   Patient Instructions  Medication Instructions:  No change  *If you need a refill on your cardiac medications before your next appointment, please  call your pharmacy*   Lab Work: Not needed     Testing/Procedures: Will be schedule at [facility] street  [place] has requested that you have an echocardiogram. Echocardiography is a painless test that uses sound waves to create images of your heart. It provides your doctor with information about the size and shape of your heart and how well your heart's chambers and valves are working. This procedure takes approximately one hour. There are no restrictions for this procedure.    Follow-Up: At [facility], you and your health needs are our priority.  As part of our continuing mission to provide you with exceptional heart care, we have created designated Provider Care Teams.  These Care Teams include your primary Cardiologist (physician) and Advanced Practice Providers (APPs -  Physician Assistants and Nurse Practitioners) who all work together to provide you with the care you need, when you need it.  We recommend signing up for the patient portal called "MyChart".  Sign up information is provided on this After Visit Summary.  MyChart is used to connect with patients for Virtual Visits (Telemedicine).  Patients are able to view lab/test results, encounter notes, upcoming appointments, etc.  Non-urgent messages can be sent to your provider as well.   To learn more about what you can do with MyChart, go to [email or web address].    Your next appointment:     as needed  The format for your next appointment:   Either In Person or Virtual  Provider:    Dr. Ellyn Hack   Other Instructions     Studies Ordered:   Orders Placed This Encounter  Procedures  . EKG 12-Lead  . ECHOCARDIOGRAM COMPLETE     Glenetta Hew, M.D., M.S. Interventional Cardiologist   Pager # [phone] Phone # [phone] [address] [place], [place] [address]   Thank you for choosing Heartcare at [facility]!!

## 2020-05-02 NOTE — Telephone Encounter (Signed)
Spoke with pt wife, okay given for her to attend appointment.

## 2020-05-02 NOTE — Patient Instructions (Addendum)
Medication Instructions:  No change  *If you need a refill on your cardiac medications before your next appointment, please call your pharmacy*   Lab Work: Not needed     Testing/Procedures: Will be schedule at [facility] street  [place] has requested that you have an echocardiogram. Echocardiography is a painless test that uses sound waves to create images of your heart. It provides your doctor with information about the size and shape of your heart and how well your heart's chambers and valves are working. This procedure takes approximately one hour. There are no restrictions for this procedure.    Follow-Up: At [facility], you and your health needs are our priority.  As part of our continuing mission to provide you with exceptional heart care, we have created designated Provider Care Teams.  These Care Teams include your primary Cardiologist (physician) and Advanced Practice Providers (APPs -  Physician Assistants and Nurse Practitioners) who all work together to provide you with the care you need, when you need it.  We recommend signing up for the patient portal called "MyChart".  Sign up information is provided on this After Visit Summary.  MyChart is used to connect with patients for Virtual Visits (Telemedicine).  Patients are able to view lab/test results, encounter notes, upcoming appointments, etc.  Non-urgent messages can be sent to your provider as well.   To learn more about what you can do with MyChart, go to [email or web address].    Your next appointment:     as needed  The format for your next appointment:   Either In Person or Virtual  Provider:   Dr. Ellyn Hack   Other Instructions

## 2020-05-05 ENCOUNTER — Encounter: Payer: Self-pay | Admitting: Cardiology

## 2020-05-09 DIAGNOSIS — R0600 Dyspnea, unspecified: Secondary | ICD-10-CM | POA: Diagnosis not present

## 2020-05-09 DIAGNOSIS — L400 Psoriasis vulgaris: Secondary | ICD-10-CM | POA: Diagnosis not present

## 2020-05-09 DIAGNOSIS — I2510 Atherosclerotic heart disease of native coronary artery without angina pectoris: Secondary | ICD-10-CM | POA: Diagnosis not present

## 2020-05-13 ENCOUNTER — Telehealth (HOSPITAL_COMMUNITY): Payer: Self-pay | Admitting: Cardiology

## 2020-05-13 NOTE — Telephone Encounter (Signed)
Patients wife called and cancelled echocardiogram due to he has already had at the PCP on This past Friday, June 11,2021. Appt was cancelled and order removed from the WQ.

## 2020-05-17 ENCOUNTER — Other Ambulatory Visit (HOSPITAL_COMMUNITY): Payer: Medicare HMO

## 2020-07-30 ENCOUNTER — Other Ambulatory Visit: Payer: Self-pay | Admitting: Internal Medicine

## 2020-07-30 DIAGNOSIS — E042 Nontoxic multinodular goiter: Secondary | ICD-10-CM | POA: Diagnosis not present

## 2020-07-30 DIAGNOSIS — Z6828 Body mass index (BMI) 28.0-28.9, adult: Secondary | ICD-10-CM | POA: Diagnosis not present

## 2020-07-30 DIAGNOSIS — E049 Nontoxic goiter, unspecified: Secondary | ICD-10-CM

## 2020-08-13 ENCOUNTER — Ambulatory Visit
Admission: RE | Admit: 2020-08-13 | Discharge: 2020-08-13 | Disposition: A | Discharge status: Home / Self Care | Payer: Medicare HMO | Source: Ambulatory Visit | Attending: Internal Medicine | Admitting: Internal Medicine

## 2020-08-13 DIAGNOSIS — E041 Nontoxic single thyroid nodule: Secondary | ICD-10-CM | POA: Diagnosis not present

## 2020-08-13 DIAGNOSIS — E049 Nontoxic goiter, unspecified: Secondary | ICD-10-CM

## 2020-08-21 ENCOUNTER — Other Ambulatory Visit: Payer: Self-pay | Admitting: Internal Medicine

## 2020-08-21 DIAGNOSIS — E042 Nontoxic multinodular goiter: Secondary | ICD-10-CM

## 2020-08-27 DIAGNOSIS — E7800 Pure hypercholesterolemia, unspecified: Secondary | ICD-10-CM | POA: Diagnosis not present

## 2020-08-27 DIAGNOSIS — Z125 Encounter for screening for malignant neoplasm of prostate: Secondary | ICD-10-CM | POA: Diagnosis not present

## 2020-08-27 DIAGNOSIS — D72819 Decreased white blood cell count, unspecified: Secondary | ICD-10-CM | POA: Diagnosis not present

## 2020-08-27 DIAGNOSIS — R7303 Prediabetes: Secondary | ICD-10-CM | POA: Diagnosis not present

## 2020-09-03 DIAGNOSIS — K4090 Unilateral inguinal hernia, without obstruction or gangrene, not specified as recurrent: Secondary | ICD-10-CM | POA: Diagnosis not present

## 2020-09-03 DIAGNOSIS — I2510 Atherosclerotic heart disease of native coronary artery without angina pectoris: Secondary | ICD-10-CM | POA: Diagnosis not present

## 2020-09-03 DIAGNOSIS — G3184 Mild cognitive impairment, so stated: Secondary | ICD-10-CM | POA: Diagnosis not present

## 2020-09-03 DIAGNOSIS — E042 Nontoxic multinodular goiter: Secondary | ICD-10-CM | POA: Diagnosis not present

## 2020-09-03 DIAGNOSIS — Z0001 Encounter for general adult medical examination with abnormal findings: Secondary | ICD-10-CM | POA: Diagnosis not present

## 2020-09-03 DIAGNOSIS — Z1212 Encounter for screening for malignant neoplasm of rectum: Secondary | ICD-10-CM | POA: Diagnosis not present

## 2020-09-03 DIAGNOSIS — L309 Dermatitis, unspecified: Secondary | ICD-10-CM | POA: Diagnosis not present

## 2020-09-03 DIAGNOSIS — Z23 Encounter for immunization: Secondary | ICD-10-CM | POA: Diagnosis not present

## 2020-09-03 DIAGNOSIS — D72819 Decreased white blood cell count, unspecified: Secondary | ICD-10-CM | POA: Diagnosis not present

## 2020-09-13 DIAGNOSIS — L400 Psoriasis vulgaris: Secondary | ICD-10-CM | POA: Diagnosis not present

## 2020-09-18 DIAGNOSIS — I10 Essential (primary) hypertension: Secondary | ICD-10-CM | POA: Diagnosis not present

## 2020-09-18 DIAGNOSIS — E785 Hyperlipidemia, unspecified: Secondary | ICD-10-CM | POA: Diagnosis not present

## 2020-09-18 DIAGNOSIS — I208 Other forms of angina pectoris: Secondary | ICD-10-CM | POA: Diagnosis not present

## 2020-09-18 DIAGNOSIS — R7303 Prediabetes: Secondary | ICD-10-CM | POA: Diagnosis not present

## 2020-09-27 DIAGNOSIS — L400 Psoriasis vulgaris: Secondary | ICD-10-CM | POA: Diagnosis not present

## 2020-10-11 DIAGNOSIS — L400 Psoriasis vulgaris: Secondary | ICD-10-CM | POA: Diagnosis not present

## 2020-10-16 ENCOUNTER — Other Ambulatory Visit: Payer: Self-pay | Admitting: Neurology

## 2020-10-17 DIAGNOSIS — L400 Psoriasis vulgaris: Secondary | ICD-10-CM | POA: Diagnosis not present

## 2020-10-21 DIAGNOSIS — L400 Psoriasis vulgaris: Secondary | ICD-10-CM | POA: Diagnosis not present

## 2020-11-04 DIAGNOSIS — L400 Psoriasis vulgaris: Secondary | ICD-10-CM | POA: Diagnosis not present

## 2020-11-18 DIAGNOSIS — L400 Psoriasis vulgaris: Secondary | ICD-10-CM | POA: Diagnosis not present

## 2021-01-22 DIAGNOSIS — D485 Neoplasm of uncertain behavior of skin: Secondary | ICD-10-CM | POA: Diagnosis not present

## 2021-01-22 DIAGNOSIS — R21 Rash and other nonspecific skin eruption: Secondary | ICD-10-CM | POA: Diagnosis not present

## 2021-01-22 DIAGNOSIS — L309 Dermatitis, unspecified: Secondary | ICD-10-CM | POA: Diagnosis not present

## 2021-01-24 DIAGNOSIS — L308 Other specified dermatitis: Secondary | ICD-10-CM | POA: Diagnosis not present

## 2021-02-07 DIAGNOSIS — L2084 Intrinsic (allergic) eczema: Secondary | ICD-10-CM | POA: Diagnosis not present

## 2021-02-21 DIAGNOSIS — L2084 Intrinsic (allergic) eczema: Secondary | ICD-10-CM | POA: Diagnosis not present

## 2021-03-07 DIAGNOSIS — L2084 Intrinsic (allergic) eczema: Secondary | ICD-10-CM | POA: Diagnosis not present

## 2021-03-19 DIAGNOSIS — R7303 Prediabetes: Secondary | ICD-10-CM | POA: Diagnosis not present

## 2021-03-19 DIAGNOSIS — E785 Hyperlipidemia, unspecified: Secondary | ICD-10-CM | POA: Diagnosis not present

## 2021-03-19 DIAGNOSIS — I10 Essential (primary) hypertension: Secondary | ICD-10-CM | POA: Diagnosis not present

## 2021-03-19 DIAGNOSIS — L309 Dermatitis, unspecified: Secondary | ICD-10-CM | POA: Diagnosis not present

## 2021-03-19 DIAGNOSIS — I208 Other forms of angina pectoris: Secondary | ICD-10-CM | POA: Diagnosis not present

## 2021-03-24 DIAGNOSIS — L2084 Intrinsic (allergic) eczema: Secondary | ICD-10-CM | POA: Diagnosis not present

## 2021-03-24 DIAGNOSIS — Z79899 Other long term (current) drug therapy: Secondary | ICD-10-CM | POA: Diagnosis not present

## 2021-04-09 DIAGNOSIS — L2084 Intrinsic (allergic) eczema: Secondary | ICD-10-CM | POA: Diagnosis not present

## 2021-04-23 DIAGNOSIS — L209 Atopic dermatitis, unspecified: Secondary | ICD-10-CM | POA: Diagnosis not present

## 2021-05-07 DIAGNOSIS — L2084 Intrinsic (allergic) eczema: Secondary | ICD-10-CM | POA: Diagnosis not present

## 2021-05-22 DIAGNOSIS — L2084 Intrinsic (allergic) eczema: Secondary | ICD-10-CM | POA: Diagnosis not present

## 2021-06-05 DIAGNOSIS — L209 Atopic dermatitis, unspecified: Secondary | ICD-10-CM | POA: Diagnosis not present

## 2021-06-19 DIAGNOSIS — L209 Atopic dermatitis, unspecified: Secondary | ICD-10-CM | POA: Diagnosis not present

## 2021-07-08 NOTE — Progress Notes (Signed)
Office Visit Note  Patient: Eric Tyler             Date of Birth: 07-Mar-1946           MRN: [id number]             PCP: [name], MD Referring: Ashley Royalty, [place] Visit Date: 07/09/2021 Occupation: Retired Marketing executive  Subjective:  New Patient (Initial Visit) (Patient is on Beaman for eczema and notices some improvement so far. Patient recently finished Prednisone which helped with skin issues. Patient complains of joint stiffness and pain in bilateral hands and feet. )   History of Present Illness: Eric Tyler is a 75 y.o. male here for hand pain and swelling. He is taking Dupixent for eczema with high BSA involvement and recent prednisone treatment for skin disease as well. He reports symptoms are worse in at least the past 1 year. He has had longstanding skin rash symptoms, previously thought to represent plaque psoriasis but years ago after biopsy apparently more consistent with atopic dermatitis. Overall joint problems are present throughout the day with some decreased range of motion worse in the mornings and partially decreased throughout the day.  Symptoms were improved with recent oral prednisone treatment for the skin disease.  He was started on Dupixent for atopic dermatitis now on this for more than 3 months there is some although very partial improvement.  09/05/2018 [facility] IMPRESSION: 1. Small area of mild reversibility involving the mid anteroseptal wall. This is suspicious for inducible ischemia. 2. Global hypokinesis. 3. Left ventricular ejection fraction 48% 4. Non invasive risk stratification*: Intermediate-based on ejection fraction.  Activities of Daily Living:  Patient reports morning stiffness for 5-10 minutes.   Patient Denies nocturnal pain.  Difficulty dressing/grooming: Denies Difficulty climbing stairs: Denies Difficulty getting out of chair: Denies Difficulty using hands for taps, buttons, cutlery, and/or writing:  Denies  Review of Systems  Constitutional:  Negative for fatigue.  HENT:  Negative for mouth sores, mouth dryness and nose dryness.   Eyes:  Positive for pain, itching and dryness. Negative for visual disturbance.  Respiratory:  Negative for cough, hemoptysis, shortness of breath and difficulty breathing.   Cardiovascular:  Negative for chest pain, palpitations and swelling in legs/feet.  Gastrointestinal:  Negative for abdominal pain, blood in stool, constipation and diarrhea.  Endocrine: Negative for increased urination.  Genitourinary:  Negative for painful urination.  Musculoskeletal:  Positive for joint pain, joint pain and morning stiffness. Negative for joint swelling, myalgias, muscle weakness, muscle tenderness and myalgias.  Skin:  Positive for rash. Negative for color change and redness.  Allergic/Immunologic: Negative for susceptible to infections.  Neurological:  Negative for dizziness, numbness, headaches, memory loss and weakness.  Hematological:  Negative for swollen glands.  Psychiatric/Behavioral:  Positive for sleep disturbance. Negative for confusion.    PMFS History:  Patient Active Problem List   Diagnosis Date Noted   Arcus senilis of both eyes 07/09/2021   Atherosclerotic heart disease of native coronary artery without angina pectoris 07/09/2021   Body mass index (BMI) 27.0-27.9, adult 07/09/2021   Eczema 07/09/2021   Leukopenia 07/09/2021   Mild cognitive disorder 07/09/2021   Non-toxic multinodular goiter 07/09/2021   Pure hypercholesterolemia 07/09/2021   Bilateral hand pain 07/09/2021   Bilateral foot pain 07/09/2021   Abnormal nuclear stress test 05/02/2020   Edema 05/02/2020   Mild cognitive impairment 10/04/2018    Past Medical History:  Diagnosis Date   Allergy  Hyperlipidemia    Memory loss    Prediabetes     Family History  Problem Relation Age of Onset   Cancer Mother    Hypertension Mother    Hypertension Father    Hyperlipidemia  Father    Hypertension Son    Past Surgical History:  Procedure Laterality Date   FINGER SURGERY     Right thumb   [facility] MYOVIEW LTD  08/2018   Myoview: EF 48% with global HK.  Mild reversibility involving the mid anteroseptal wall suspicious for ischemia.  Intermediate risk.   Social History   Social History Narrative   Lives with wife.  He has a living will with attorney Arville Lime).      He moved to [place] from [place] in 2019.  He has been seen by Dr. 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   Immunization History  Administered Date(s) Administered   Influenza, High Dose Seasonal PF 07/29/2015, 07/28/2016   Influenza, Quadrivalent, Recombinant, Inj, Pf 08/16/2017, 08/22/2018, 08/29/2019, 09/03/2020   PFIZER(Purple Top)SARS-COV-2 Vaccination 02/04/2020, 03/05/2020   Pneumococcal Conjugate-13 08/12/2017   Td 08/22/2018     Objective: Vital Signs: BP 127/75 (BP Location: Right Arm, Patient Position: Sitting, Cuff Size: Normal)   Pulse 64   Ht _0  (1.803 m)   Wt 200 lb 11.2 oz (91 kg)   BMI 27.99 kg/m    Physical Exam Cardiovascular:     Rate and Rhythm: Normal rate and regular rhythm.  Pulmonary:     Effort: Pulmonary effort is normal.     Breath sounds: Normal breath sounds.  Skin:    General: Skin is warm and dry.     Findings: Rash present.     Comments: Very diffuse skin rash with extensive scaling and hyperpigmentation of multiple areas with pretty much complete involvement from the hands to elbows and feet to knees  Neurological:     Mental Status: He is alert.     Musculoskeletal Exam:  Shoulders full ROM no tenderness or swelling Elbows full ROM no tenderness or swelling Wrists full ROM no tenderness or swelling Fingers decreased flexion range of motion bilaterally with overlying skin tightness and thickening and rash, no palpable joint synovitis Knees full ROM no  tenderness or swelling Ankles full ROM no tenderness or swelling  Investigation: No additional findings.  Imaging: XR Hand 2 View Left  Result Date: 07/14/2021 Xray left hand 2 views Radiocarpal carpal joint spaces appear normal.  Multiple cystic changes present throughout the carpal bones.  MCP joint spaces appear normal.  No periosteal reaction present of her proximal phalanges.  Lateral osteophyte formations at PIP and DIP joints.  Irregular joint space narrowing worst at second and third DIPs with no visible erosions or local bone demineralization. Impression Moderate degenerative arthritis primarily in distal finger joints with no visible erosive disease although periosteal reactions are present  XR Hand 2 View Right  Result Date: 07/14/2021 Xray right hand 2 views Radiocarpal and carpal joint spaces appear normal. 1st CMC and MCP joint spaces appear intact. Normal MCP joint spaces 3rd proximal phalanx change surface damage or cystic change in only 1 view. Lateral osteophyte formations in PIP and DIP joints with no erosions seen. Bone mineralization appears normal. Impression Mild diffuse degenerative changes, questionable 3rd MCP joint change favor degenerative or cystic change vs erosion   Recent Labs: Lab Results  Component Value Date  WBC 3.7 (L) 03/25/2018   HGB 13.2 03/25/2018   PLT 269 03/25/2018   NA 141 10/18/2015   K 4.2 10/18/2015   CL 105 10/18/2015   CO2 27 10/18/2015   GLUCOSE 123 (H) 10/18/2015   BUN 14 10/18/2015   CREATININE 1.19 10/18/2015   BILITOT 0.9 10/18/2015   ALKPHOS 55 10/18/2015   AST 30 10/18/2015   ALT 28 10/18/2015   PROT 7.5 10/18/2015   ALBUMIN 4.4 10/18/2015   CALCIUM 9.6 10/18/2015   GFRAA >60 10/18/2015    Speciality Comments: No specialty comments available.  Procedures:  No procedures performed Allergies: Patient has no known allergies.   Assessment / Plan:     Visit Diagnoses: Bilateral hand pain  Bilateral foot pain - Plan: XR  Hand 2 View Right, XR Hand 2 View Left, Rheumatoid factor, Cyclic citrul peptide antibody, IgG, Sedimentation rate, C-reactive protein  Bilateral hand and foot pain I do not see obvious inflammatory change on exam today be influenced by the overlying skin inflammation.  Checking bilateral hand x-rays for erosive disease changes.  We will check rheumatoid factor and CCP antibody test.  Checking ESR and CRP for markers of active systemic inflammation.  Could be degenerative arthritis changes with superimposed skin inflammation though remains somewhat suspicious for psoriatic arthritis if any objective evidence of inflammation.  Eczema, unspecified type  Extensive skin disease activity so far limited improvement on the Dupixent.  Still have some suspicion for psoriatic disease, possibly coinciding.  Orders: Orders Placed This Encounter  Procedures   XR Hand 2 View Right   XR Hand 2 View Left   Rheumatoid factor   Cyclic citrul peptide antibody, IgG   Sedimentation rate   C-reactive protein    No orders of the defined types were placed in this encounter.    Follow-Up Instructions: Return in about 2 weeks (around 07/23/2021) for New pt ?arthritis f/u 2 wks.   Collier Salina, MD  Note - This record has been created using [employer].  Chart creation errors have been sought, but may not always  have been located. Such creation errors do not reflect on  the standard of medical care.

## 2021-07-09 ENCOUNTER — Ambulatory Visit: Payer: Self-pay

## 2021-07-09 ENCOUNTER — Ambulatory Visit: Payer: Medicare Other | Admitting: Internal Medicine

## 2021-07-09 ENCOUNTER — Encounter: Payer: Self-pay | Admitting: Internal Medicine

## 2021-07-09 ENCOUNTER — Other Ambulatory Visit: Payer: Self-pay

## 2021-07-09 VITALS — BP 127/75 | HR 64 | Ht 71.0 in | Wt 200.7 lb

## 2021-07-09 DIAGNOSIS — E7800 Pure hypercholesterolemia, unspecified: Secondary | ICD-10-CM | POA: Insufficient documentation

## 2021-07-09 DIAGNOSIS — H18413 Arcus senilis, bilateral: Secondary | ICD-10-CM | POA: Insufficient documentation

## 2021-07-09 DIAGNOSIS — M79672 Pain in left foot: Secondary | ICD-10-CM | POA: Diagnosis not present

## 2021-07-09 DIAGNOSIS — M79671 Pain in right foot: Secondary | ICD-10-CM

## 2021-07-09 DIAGNOSIS — D72819 Decreased white blood cell count, unspecified: Secondary | ICD-10-CM | POA: Insufficient documentation

## 2021-07-09 DIAGNOSIS — E042 Nontoxic multinodular goiter: Secondary | ICD-10-CM | POA: Insufficient documentation

## 2021-07-09 DIAGNOSIS — Z6827 Body mass index (BMI) 27.0-27.9, adult: Secondary | ICD-10-CM | POA: Insufficient documentation

## 2021-07-09 DIAGNOSIS — I2510 Atherosclerotic heart disease of native coronary artery without angina pectoris: Secondary | ICD-10-CM | POA: Insufficient documentation

## 2021-07-09 DIAGNOSIS — L309 Dermatitis, unspecified: Secondary | ICD-10-CM | POA: Insufficient documentation

## 2021-07-09 DIAGNOSIS — M79641 Pain in right hand: Secondary | ICD-10-CM | POA: Diagnosis not present

## 2021-07-09 DIAGNOSIS — M79642 Pain in left hand: Secondary | ICD-10-CM | POA: Insufficient documentation

## 2021-07-09 DIAGNOSIS — F09 Unspecified mental disorder due to known physiological condition: Secondary | ICD-10-CM | POA: Insufficient documentation

## 2021-07-09 DIAGNOSIS — H903 Sensorineural hearing loss, bilateral: Secondary | ICD-10-CM | POA: Insufficient documentation

## 2021-07-10 LAB — CYCLIC CITRUL PEPTIDE ANTIBODY, IGG: Cyclic Citrullin Peptide Ab: <16 UNITS

## 2021-07-10 LAB — RHEUMATOID FACTOR: Rheumatoid fact SerPl-aCnc: <14 IU/mL (ref <14)

## 2021-07-10 LAB — SEDIMENTATION RATE: Sed Rate: 6 mm/h (ref 0–20)

## 2021-07-10 LAB — C-REACTIVE PROTEIN: CRP: 1.7 mg/L (ref <8.0)

## 2021-07-22 NOTE — Progress Notes (Signed)
Office Visit Note  Patient: Eric Tyler             Date of Birth: Jan 01, 1946           MRN: [id number]             PCP: [name], MD Referring: [name], MD Visit Date: 07/23/2021   Subjective:  Follow-up (Patient is doing fairly well overall, patient continues on treatment for eczema.)   History of Present Illness: Eric Tyler is a 75 y.o. male here for follow up for bilateral hand pain and swelling alongside his extensive skin rashes. At initial visit inflammatory markers were normal and RF and CCP Abs neg. Xrays showed degenerative changes no clear erosions or inflammatory changes. His symptoms remain the same since our last visit.   07/09/21 Eric Tyler is a 75 y.o. male here for hand pain and swelling. He is taking Dupixent for eczema with high BSA involvement and recent prednisone treatment for skin disease as well. He reports symptoms are worse in at least the past 1 year. He has had longstanding skin rash symptoms, previously thought to represent plaque psoriasis but years ago after biopsy apparently more consistent with atopic dermatitis. Overall joint problems are present throughout the day with some decreased range of motion worse in the mornings and partially decreased throughout the day.  Symptoms were improved with recent oral prednisone treatment for the skin disease.  He was started on Dupixent for atopic dermatitis now on this for more than 3 months there is some although very partial improvement.   09/05/2018 [facility] IMPRESSION: 1. Small area of mild reversibility involving the mid anteroseptal wall. This is suspicious for inducible ischemia. 2. Global hypokinesis. 3. Left ventricular ejection fraction 48% 4. Non invasive risk stratification*: Intermediate-based on ejection fraction.   Review of Systems  Constitutional:  Negative for fatigue.  HENT:  Negative for mouth sores, mouth dryness and nose dryness.   Eyes:  Negative for pain,  itching, visual disturbance and dryness.  Respiratory:  Negative for cough, hemoptysis, shortness of breath and difficulty breathing.   Cardiovascular:  Negative for chest pain, palpitations and swelling in legs/feet.  Gastrointestinal:  Negative for abdominal pain, blood in stool, constipation and diarrhea.  Endocrine: Negative for increased urination.  Genitourinary:  Negative for painful urination.  Musculoskeletal:  Negative for joint pain, joint pain, joint swelling, myalgias, muscle weakness, morning stiffness, muscle tenderness and myalgias.  Skin:  Positive for rash. Negative for color change and redness.  Allergic/Immunologic: Negative for susceptible to infections.  Neurological:  Negative for dizziness, numbness, headaches, memory loss and weakness.  Hematological:  Negative for swollen glands.  Psychiatric/Behavioral:  Negative for confusion and sleep disturbance.    PMFS History:  Patient Active Problem List   Diagnosis Date Noted   Arcus senilis of both eyes 07/09/2021   Atherosclerotic heart disease of native coronary artery without angina pectoris 07/09/2021   Body mass index (BMI) 27.0-27.9, adult 07/09/2021   Eczema 07/09/2021   Leukopenia 07/09/2021   Mild cognitive disorder 07/09/2021   Non-toxic multinodular goiter 07/09/2021   Pure hypercholesterolemia 07/09/2021   Bilateral hand pain 07/09/2021   Bilateral foot pain 07/09/2021   Abnormal nuclear stress test 05/02/2020   Edema 05/02/2020   Mild cognitive impairment 10/04/2018    Past Medical History:  Diagnosis Date   Allergy    Hyperlipidemia    Memory loss    Prediabetes     Family History  Problem  Relation Age of Onset   Cancer Mother    Hypertension Mother    Hypertension Father    Hyperlipidemia Father    Hypertension Son    Past Surgical History:  Procedure Laterality Date   FINGER SURGERY     Right thumb   [facility] MYOVIEW LTD  08/2018   Myoview: EF 48% with global HK.  Mild reversibility  involving the mid anteroseptal wall suspicious for ischemia.  Intermediate risk.   Social History   Social History Narrative   Lives with wife.  He has a living will with attorney Arville Lime).      He moved to [place] from [place] in 2019.  He has been seen by Dr. 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   Immunization History  Administered Date(s) Administered   Influenza, High Dose Seasonal PF 07/29/2015, 07/28/2016   Influenza, Quadrivalent, Recombinant, Inj, Pf 08/16/2017, 08/22/2018, 08/29/2019, 09/03/2020   PFIZER(Purple Top)SARS-COV-2 Vaccination 02/04/2020, 03/05/2020   Pneumococcal Conjugate-13 08/12/2017   Td 08/22/2018     Objective: Vital Signs: BP 130/80 (BP Location: Left Arm, Patient Position: Sitting, Cuff Size: Normal)   Pulse 71   Ht '5\' 11"'$  (1.803 m)   Wt 201 lb 9.6 oz (91.4 kg)   BMI 28.12 kg/m    Physical Exam Skin:    General: Skin is warm and dry.     Comments: Extensive scaling hyperpigmented rash on hands and feet and throughout extremities worse on extensor surfaces, hypopigmentation in flexural surface of elbows Isolated brown, dystrophic nail changes in a few digits.  Neurological:     General: No focal deficit present.  Psychiatric:        Mood and Affect: Mood normal.     Musculoskeletal Exam:  Elbows full ROM no tenderness or swelling Wrists full ROM no tenderness or swelling Fingers full ROM no tenderness, no obvious synovitis although extensive overlying skin rash with some thickening Knees full ROM no tenderness or swelling   Investigation: No additional findings.  Imaging: XR Hand 2 View Left  Result Date: 07/14/2021 Xray left hand 2 views Radiocarpal carpal joint spaces appear normal.  Multiple cystic changes present throughout the carpal bones.  MCP joint spaces appear normal.  No periosteal reaction present of her proximal phalanges.  Lateral  osteophyte formations at PIP and DIP joints.  Irregular joint space narrowing worst at second and third DIPs with no visible erosions or local bone demineralization. Impression Moderate degenerative arthritis primarily in distal finger joints with no visible erosive disease although periosteal reactions are present  XR Hand 2 View Right  Result Date: 07/14/2021 Xray right hand 2 views Radiocarpal and carpal joint spaces appear normal. 1st CMC and MCP joint spaces appear intact. Normal MCP joint spaces 3rd proximal phalanx change surface damage or cystic change in only 1 view. Lateral osteophyte formations in PIP and DIP joints with no erosions seen. Bone mineralization appears normal. Impression Mild diffuse degenerative changes, questionable 3rd MCP joint change favor degenerative or cystic change vs erosion   Recent Labs: Lab Results  Component Value Date   WBC 3.7 (L) 03/25/2018   HGB 13.2 03/25/2018   PLT 269 03/25/2018   NA 141 10/18/2015   K 4.2 10/18/2015   CL 105 10/18/2015   CO2 27 10/18/2015   GLUCOSE 123 (H) 10/18/2015   BUN 14 10/18/2015   CREATININE 1.19 10/18/2015   BILITOT 0.9 10/18/2015  ALKPHOS 55 10/18/2015   AST 30 10/18/2015   ALT 28 10/18/2015   PROT 7.5 10/18/2015   ALBUMIN 4.4 10/18/2015   CALCIUM 9.6 10/18/2015   GFRAA >60 10/18/2015    Speciality Comments: No specialty comments available.  Procedures:  No procedures performed Allergies: Patient has no known allergies.   Assessment / Plan:     Visit Diagnoses: Bilateral hand pain Bilateral foot pain  Inflammatory markers are negative, RA antibody markers negative, xrays without erosive disease or demineralization. There is some mild periosteal reaction cannot completely exclude psoriatic disease but apparently skin rash has bene biopsied multiple times and most consistent with atopic dermatitis. Recommend conservative treatment for osteoarthritis and continued treatment of his skin inflammation at this  time.  Eczema, unspecified type  Will try and call to dermatologist's office to review their assessment or whether seems suspicious for an overlap process that could cause a seronegative disease.  Orders: No orders of the defined types were placed in this encounter.  No orders of the defined types were placed in this encounter.    Follow-Up Instructions: No follow-ups on file.   Collier Salina, MD  Note - This record has been created using [employer].  Chart creation errors have been sought, but may not always  have been located. Such creation errors do not reflect on  the standard of medical care.

## 2021-07-23 ENCOUNTER — Other Ambulatory Visit: Payer: Self-pay

## 2021-07-23 ENCOUNTER — Encounter: Payer: Self-pay | Admitting: Internal Medicine

## 2021-07-23 ENCOUNTER — Telehealth: Payer: Self-pay | Admitting: Radiology

## 2021-07-23 ENCOUNTER — Ambulatory Visit (INDEPENDENT_AMBULATORY_CARE_PROVIDER_SITE_OTHER): Payer: Medicare Other | Admitting: Internal Medicine

## 2021-07-23 VITALS — BP 130/80 | HR 71 | Ht 71.0 in | Wt 201.6 lb

## 2021-07-23 DIAGNOSIS — M79671 Pain in right foot: Secondary | ICD-10-CM | POA: Diagnosis not present

## 2021-07-23 DIAGNOSIS — M79672 Pain in left foot: Secondary | ICD-10-CM | POA: Diagnosis not present

## 2021-07-23 DIAGNOSIS — E079 Disorder of thyroid, unspecified: Secondary | ICD-10-CM | POA: Diagnosis not present

## 2021-07-23 DIAGNOSIS — L309 Dermatitis, unspecified: Secondary | ICD-10-CM | POA: Diagnosis not present

## 2021-07-23 DIAGNOSIS — M79641 Pain in right hand: Secondary | ICD-10-CM | POA: Diagnosis not present

## 2021-07-23 DIAGNOSIS — M79642 Pain in left hand: Secondary | ICD-10-CM | POA: Diagnosis not present

## 2021-07-23 DIAGNOSIS — E042 Nontoxic multinodular goiter: Secondary | ICD-10-CM | POA: Diagnosis not present

## 2021-07-23 NOTE — Telephone Encounter (Signed)
Reached out to [facility] regarding mutual patient and to let their office know Dr. Benjamine Mola would like to speak directly with patient's provider, Gloris Manchester, PA-C. Left Dr. Marveen Reeks cell phone number where he can be reached to discuss patient's care.

## 2021-07-24 DIAGNOSIS — L2084 Intrinsic (allergic) eczema: Secondary | ICD-10-CM | POA: Diagnosis not present

## 2021-07-28 ENCOUNTER — Ambulatory Visit (HOSPITAL_COMMUNITY)
Admission: EM | Admit: 2021-07-28 | Discharge: 2021-07-28 | Disposition: A | Discharge status: Home / Self Care | Payer: Medicare Other | Attending: Emergency Medicine | Admitting: Emergency Medicine

## 2021-07-28 ENCOUNTER — Encounter (HOSPITAL_COMMUNITY): Payer: Self-pay | Admitting: Emergency Medicine

## 2021-07-28 ENCOUNTER — Other Ambulatory Visit: Payer: Self-pay

## 2021-07-28 DIAGNOSIS — M79672 Pain in left foot: Secondary | ICD-10-CM

## 2021-07-28 MED ORDER — IBUPROFEN 600 MG PO TABS: 600.0000 mg | ORAL_TABLET | Freq: Four times a day (QID) | ORAL | 0 refills | Status: DC | PRN

## 2021-07-28 MED ORDER — LIDOCAINE 5 % EX OINT: 1.0000 "application " | TOPICAL_OINTMENT | CUTANEOUS | 0 refills | Status: DC | PRN

## 2021-07-28 NOTE — ED Triage Notes (Signed)
Pt presents with left foot pain that started last night.

## 2021-07-28 NOTE — ED Provider Notes (Signed)
[facility]    CSN: [id number] Arrival date & time: 07/28/21  1046      History   Chief Complaint Chief Complaint  Patient presents with   Foot Pain    Left    HPI Eric Tyler is a 75 y.o. male.   Patient here for evaluation of left toe pain that started last night.  Reports pain feels like pins across all 5 toes.  Reports pain is different than arthritic pain in the past.  Denies any injury or trauma.  Has not taken any OTC medications or treatments.  Patient with full range of motion in toes, foot, and ankle.  Denies any trauma, injury, or other precipitating event.  Denies any specific alleviating or aggravating factors.  Denies any fevers, chest pain, shortness of breath, N/V/D, numbness, tingling, weakness, abdominal pain, or headaches.    The history is provided by the patient.  Foot Pain   Past Medical History:  Diagnosis Date   Allergy    Hyperlipidemia    Memory loss    Prediabetes     Patient Active Problem List   Diagnosis Date Noted   Arcus senilis of both eyes 07/09/2021   Atherosclerotic heart disease of native coronary artery without angina pectoris 07/09/2021   Body mass index (BMI) 27.0-27.9, adult 07/09/2021   Eczema 07/09/2021   Leukopenia 07/09/2021   Mild cognitive disorder 07/09/2021   Non-toxic multinodular goiter 07/09/2021   Pure hypercholesterolemia 07/09/2021   Bilateral hand pain 07/09/2021   Bilateral foot pain 07/09/2021   Abnormal nuclear stress test 05/02/2020   Edema 05/02/2020   Mild cognitive impairment 10/04/2018    Past Surgical History:  Procedure Laterality Date   FINGER SURGERY     Right thumb   NM MYOVIEW LTD  08/2018   Myoview: EF 48% with global HK.  Mild reversibility involving the mid anteroseptal wall suspicious for ischemia.  Intermediate risk.       Home Medications    Prior to Admission medications   Medication Sig Start Date End Date Taking? Authorizing Provider  ibuprofen (ADVIL) 600 MG  tablet Take 1 tablet (600 mg total) by mouth every 6 (six) hours as needed. 07/28/21  Yes Pearson Forster, NP  lidocaine (XYLOCAINE) 5 % ointment Apply 1 application topically as needed. 07/28/21  Yes Pearson Forster, NP  Acetaminophen (TYLENOL 8 HOUR ARTHRITIS PAIN PO) Take 1 tablet by mouth 3 (three) times daily as needed. Patient not taking: No sig reported 09/24/17   [provider]  aspirin EC 81 MG tablet Take 81 mg by mouth daily.    [provider]  cetirizine (ZYRTEC) 10 MG tablet Take 1 tablet (10 mg total) by mouth daily for 10 days. 11/05/17 05/02/20  Horald Pollen, MD  clobetasol ointment (TEMOVATE) 0.05 % SMARTSIG:Sparingly Topical Twice Daily 07/07/21   [provider]  donepezil (ARICEPT) 10 MG tablet TAKE 1 TABLET BY MOUTH EVERYDAY AT BEDTIME 10/16/20   Suzzanne Cloud, NP  Ferrous Sulfate (IRON) 325 (65 Fe) MG TABS 1 tablet    [provider]  furosemide (LASIX) 20 MG tablet Take 20 mg by mouth daily. 05/12/21   [provider]  metoprolol succinate (TOPROL-XL) 25 MG 24 hr tablet Take 1 tablet by mouth daily.    [provider]  Multiple Vitamin (MULTIVITAMIN WITH MINERALS) TABS tablet Take 1 tablet by mouth daily.    [provider]  omega-3 acid ethyl esters (LOVAZA) 1 G capsule Take  1 g by mouth 2 (two) times daily. Patient not taking: No sig reported    [provider]  Risankizumab-rzaa,150 MG Dose, (SKYRIZI, 150 MG DOSE,) 75 MG/0.83ML PSKT Inject into the skin. Prescribed by a different provider    [provider]    Family History Family History  Problem Relation Age of Onset   Cancer Mother    Hypertension Mother    Hypertension Father    Hyperlipidemia Father    Hypertension Son     Social History Social History   Tobacco Use   Smoking status: Never   Smokeless tobacco: Never  Vaping Use   Vaping Use: Never used  Substance Use Topics   Alcohol use: No    Alcohol/week: 0.0  standard drinks   Drug use: No     Allergies   Patient has no known allergies.   Review of Systems Review of Systems  Musculoskeletal:  Positive for arthralgias.  All other systems reviewed and are negative.   Physical Exam Triage Vital Signs ED Triage Vitals  Enc Vitals Group     BP 07/28/21 1214 (!) 148/74     Pulse Rate 07/28/21 1214 68     Resp 07/28/21 1214 18     Temp 07/28/21 1214 98.3 F (36.8 C)     Temp Source 07/28/21 1214 Oral     SpO2 07/28/21 1214 97 %     Weight --      Height --      Head Circumference --      Peak Flow --      Pain Score 07/28/21 1213 3     Pain Loc --      Pain Edu? --      Excl. in [place]? --    No data found.  Updated Vital Signs BP (!) 148/74 (BP Location: Right Arm)   Pulse 68   Temp 98.3 F (36.8 C) (Oral)   Resp 18   SpO2 97%   Visual Acuity Right Eye Distance:   Left Eye Distance:   Bilateral Distance:    Right Eye Near:   Left Eye Near:    Bilateral Near:     Physical Exam Vitals and nursing note reviewed.  Constitutional:      General: He is not in acute distress.    Appearance: Normal appearance. He is not ill-appearing, toxic-appearing or diaphoretic.  HENT:     Head: Normocephalic and atraumatic.  Eyes:     Conjunctiva/sclera: Conjunctivae normal.  Cardiovascular:     Rate and Rhythm: Normal rate.     Pulses: Normal pulses.  Pulmonary:     Effort: Pulmonary effort is normal.  Abdominal:     General: Abdomen is flat.  Musculoskeletal:        General: Normal range of motion.     Cervical back: Normal range of motion.     Right foot: Normal.     Left foot: Normal range of motion. No swelling, tenderness or bony tenderness. Normal pulse.  Skin:    General: Skin is warm and dry.  Neurological:     General: No focal deficit present.     Mental Status: He is alert and oriented to person, place, and time.  Psychiatric:        Mood and Affect: Mood normal.     UC Treatments / Results  Labs (all  labs ordered are listed, but only abnormal results are displayed) Labs Reviewed - No data to display  EKG  Radiology No results found.  Procedures Procedures (including critical care time)  Medications Ordered in UC Medications - No data to display  Initial Impression / Assessment and Plan / UC Course  I have reviewed the triage vital signs and the nursing notes.  Pertinent labs & imaging results that were available during my care of the patient were reviewed by me and considered in my medical decision making (see chart for details).    Left foot pain.  Assessment negative for red flags or concerns.  Neuropathic pain versus arthritis flare.  Will treat with ibuprofen 3 times daily as needed, may also take Tylenol.  Lidocaine ointment daily as needed for pain.  Discussed conservative symptom management as described in discharge instructions.  Recommend following up with PCP or rheumatologist for further evaluation of foot pain. Final Clinical Impressions(s) / UC Diagnoses   Final diagnoses:  Foot pain, left     Discharge Instructions      Take the ibuprofen three times a day as needed for pain.  You can apply the lidocaine ointment daily as needed for pain.   You can try heat, ice, or alternate between heat and ice for comfort.  You can also try biofreeze, bengay, or voltaren gel as needed.   Follow up with your primary care provider or arthritis doctor as soon as possible for re-evaluation of foot pain.      ED Prescriptions     Medication Sig Dispense Auth. Provider   lidocaine (XYLOCAINE) 5 % ointment Apply 1 application topically as needed. 35.44 g Pearson Forster, NP   ibuprofen (ADVIL) 600 MG tablet Take 1 tablet (600 mg total) by mouth every 6 (six) hours as needed. 30 tablet Pearson Forster, NP      PDMP not reviewed this encounter.   Pearson Forster, NP 07/28/21 1244

## 2021-07-28 NOTE — Discharge Instructions (Addendum)
Take the ibuprofen three times a day as needed for pain.  You can apply the lidocaine ointment daily as needed for pain.   You can try heat, ice, or alternate between heat and ice for comfort.  You can also try biofreeze, bengay, or voltaren gel as needed.   Follow up with your primary care provider or arthritis doctor as soon as possible for re-evaluation of foot pain.

## 2021-07-30 DIAGNOSIS — E042 Nontoxic multinodular goiter: Secondary | ICD-10-CM | POA: Diagnosis not present

## 2021-08-18 ENCOUNTER — Ambulatory Visit
Admission: RE | Admit: 2021-08-18 | Discharge: 2021-08-18 | Disposition: A | Discharge status: Home / Self Care | Payer: Medicare Other | Source: Ambulatory Visit | Attending: Internal Medicine | Admitting: Internal Medicine

## 2021-08-18 DIAGNOSIS — E041 Nontoxic single thyroid nodule: Secondary | ICD-10-CM | POA: Diagnosis not present

## 2021-08-18 DIAGNOSIS — E042 Nontoxic multinodular goiter: Secondary | ICD-10-CM

## 2021-08-18 DIAGNOSIS — R599 Enlarged lymph nodes, unspecified: Secondary | ICD-10-CM | POA: Diagnosis not present

## 2021-08-18 DIAGNOSIS — G930 Cerebral cysts: Secondary | ICD-10-CM | POA: Diagnosis not present

## 2021-08-21 DIAGNOSIS — D485 Neoplasm of uncertain behavior of skin: Secondary | ICD-10-CM | POA: Diagnosis not present

## 2021-08-21 DIAGNOSIS — L309 Dermatitis, unspecified: Secondary | ICD-10-CM | POA: Diagnosis not present

## 2021-08-21 DIAGNOSIS — L989 Disorder of the skin and subcutaneous tissue, unspecified: Secondary | ICD-10-CM | POA: Diagnosis not present

## 2021-08-21 DIAGNOSIS — L298 Other pruritus: Secondary | ICD-10-CM | POA: Diagnosis not present

## 2021-08-26 DIAGNOSIS — D485 Neoplasm of uncertain behavior of skin: Secondary | ICD-10-CM | POA: Diagnosis not present

## 2021-09-15 DIAGNOSIS — C8400 Mycosis fungoides, unspecified site: Secondary | ICD-10-CM | POA: Diagnosis not present

## 2021-09-19 DIAGNOSIS — J019 Acute sinusitis, unspecified: Secondary | ICD-10-CM | POA: Diagnosis not present

## 2021-09-19 DIAGNOSIS — J40 Bronchitis, not specified as acute or chronic: Secondary | ICD-10-CM | POA: Diagnosis not present

## 2021-10-08 DIAGNOSIS — R7303 Prediabetes: Secondary | ICD-10-CM | POA: Diagnosis not present

## 2021-10-08 DIAGNOSIS — E7800 Pure hypercholesterolemia, unspecified: Secondary | ICD-10-CM | POA: Diagnosis not present

## 2021-10-08 DIAGNOSIS — D72819 Decreased white blood cell count, unspecified: Secondary | ICD-10-CM | POA: Diagnosis not present

## 2021-10-14 DIAGNOSIS — E079 Disorder of thyroid, unspecified: Secondary | ICD-10-CM | POA: Diagnosis not present

## 2021-10-14 DIAGNOSIS — G3184 Mild cognitive impairment, so stated: Secondary | ICD-10-CM | POA: Diagnosis not present

## 2021-10-14 DIAGNOSIS — Z0000 Encounter for general adult medical examination without abnormal findings: Secondary | ICD-10-CM | POA: Diagnosis not present

## 2021-10-14 DIAGNOSIS — D72819 Decreased white blood cell count, unspecified: Secondary | ICD-10-CM | POA: Diagnosis not present

## 2021-10-14 DIAGNOSIS — I2510 Atherosclerotic heart disease of native coronary artery without angina pectoris: Secondary | ICD-10-CM | POA: Diagnosis not present

## 2021-10-16 DIAGNOSIS — C8400 Mycosis fungoides, unspecified site: Secondary | ICD-10-CM | POA: Diagnosis not present

## 2021-11-04 DIAGNOSIS — H25813 Combined forms of age-related cataract, bilateral: Secondary | ICD-10-CM | POA: Diagnosis not present

## 2021-11-04 DIAGNOSIS — H5213 Myopia, bilateral: Secondary | ICD-10-CM | POA: Diagnosis not present

## 2021-11-04 DIAGNOSIS — H52203 Unspecified astigmatism, bilateral: Secondary | ICD-10-CM | POA: Diagnosis not present

## 2021-11-04 DIAGNOSIS — H524 Presbyopia: Secondary | ICD-10-CM | POA: Diagnosis not present

## 2021-11-17 DIAGNOSIS — C8400 Mycosis fungoides, unspecified site: Secondary | ICD-10-CM | POA: Diagnosis not present

## 2021-11-17 DIAGNOSIS — L298 Other pruritus: Secondary | ICD-10-CM | POA: Diagnosis not present

## 2021-12-17 DIAGNOSIS — H25011 Cortical age-related cataract, right eye: Secondary | ICD-10-CM | POA: Diagnosis not present

## 2021-12-17 DIAGNOSIS — H25012 Cortical age-related cataract, left eye: Secondary | ICD-10-CM | POA: Diagnosis not present

## 2021-12-17 DIAGNOSIS — H2511 Age-related nuclear cataract, right eye: Secondary | ICD-10-CM | POA: Diagnosis not present

## 2021-12-17 DIAGNOSIS — H2512 Age-related nuclear cataract, left eye: Secondary | ICD-10-CM | POA: Diagnosis not present

## 2021-12-24 DIAGNOSIS — H2512 Age-related nuclear cataract, left eye: Secondary | ICD-10-CM | POA: Diagnosis not present

## 2021-12-24 DIAGNOSIS — H25012 Cortical age-related cataract, left eye: Secondary | ICD-10-CM | POA: Diagnosis not present

## 2022-01-01 DIAGNOSIS — C8400 Mycosis fungoides, unspecified site: Secondary | ICD-10-CM | POA: Diagnosis not present

## 2022-01-15 DIAGNOSIS — R590 Localized enlarged lymph nodes: Secondary | ICD-10-CM | POA: Diagnosis not present

## 2022-01-15 DIAGNOSIS — E041 Nontoxic single thyroid nodule: Secondary | ICD-10-CM | POA: Diagnosis not present

## 2022-01-15 DIAGNOSIS — C8400 Mycosis fungoides, unspecified site: Secondary | ICD-10-CM | POA: Diagnosis not present

## 2022-01-20 DIAGNOSIS — L299 Pruritus, unspecified: Secondary | ICD-10-CM | POA: Diagnosis not present

## 2022-01-20 DIAGNOSIS — C8400 Mycosis fungoides, unspecified site: Secondary | ICD-10-CM | POA: Diagnosis not present

## 2022-03-10 DIAGNOSIS — Z79631 Long term (current) use of antimetabolite agent: Secondary | ICD-10-CM | POA: Diagnosis not present

## 2022-03-10 DIAGNOSIS — C8400 Mycosis fungoides, unspecified site: Secondary | ICD-10-CM | POA: Diagnosis not present

## 2022-03-10 DIAGNOSIS — L299 Pruritus, unspecified: Secondary | ICD-10-CM | POA: Diagnosis not present

## 2022-03-10 DIAGNOSIS — Z5181 Encounter for therapeutic drug level monitoring: Secondary | ICD-10-CM | POA: Diagnosis not present

## 2022-03-10 DIAGNOSIS — Z79899 Other long term (current) drug therapy: Secondary | ICD-10-CM | POA: Diagnosis not present

## 2022-04-28 DIAGNOSIS — R110 Nausea: Secondary | ICD-10-CM | POA: Diagnosis not present

## 2022-04-28 DIAGNOSIS — Z79631 Long term (current) use of antimetabolite agent: Secondary | ICD-10-CM | POA: Diagnosis not present

## 2022-04-28 DIAGNOSIS — Z5181 Encounter for therapeutic drug level monitoring: Secondary | ICD-10-CM | POA: Diagnosis not present

## 2022-04-28 DIAGNOSIS — Z79899 Other long term (current) drug therapy: Secondary | ICD-10-CM | POA: Diagnosis not present

## 2022-04-28 DIAGNOSIS — C8400 Mycosis fungoides, unspecified site: Secondary | ICD-10-CM | POA: Diagnosis not present

## 2022-04-28 DIAGNOSIS — T887XXA Unspecified adverse effect of drug or medicament, initial encounter: Secondary | ICD-10-CM | POA: Diagnosis not present

## 2022-04-28 DIAGNOSIS — L299 Pruritus, unspecified: Secondary | ICD-10-CM | POA: Diagnosis not present

## 2022-05-07 DIAGNOSIS — Z961 Presence of intraocular lens: Secondary | ICD-10-CM | POA: Diagnosis not present

## 2022-05-07 IMAGING — US US THYROID
1 series · 13 of 25 positions shown · non-contrast
Comparison: Prior thyroid ultrasound 08/13/2020; 09/01/2018

CLINICAL DATA: Prior ultrasound follow-up.

EXAM:
THYROID ULTRASOUND
TECHNIQUE: Ultrasound examination of the thyroid gland and adjacent soft
tissues was performed.

[Series 1: us thyroid · 0.11mm/px · 13 of 40 slices shown]
[im 1/40]
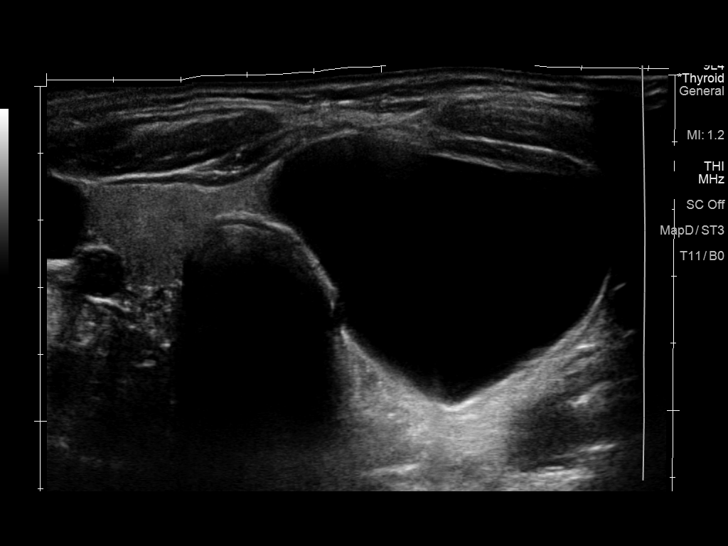
[im 4/40]
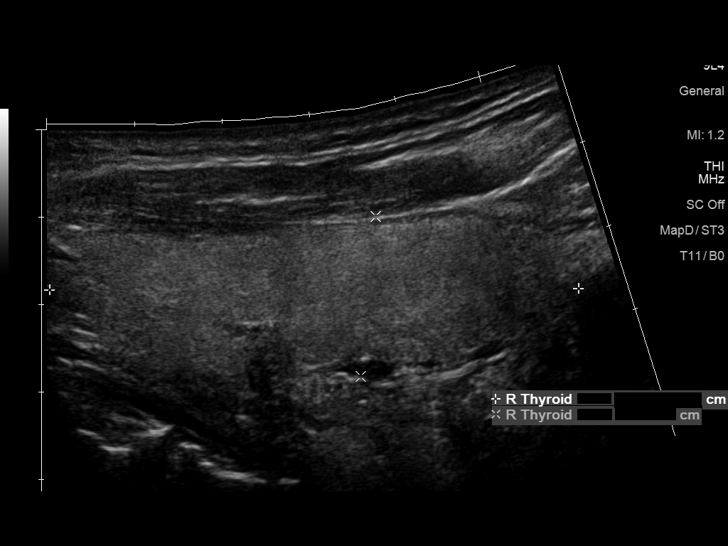
[im 7/40]
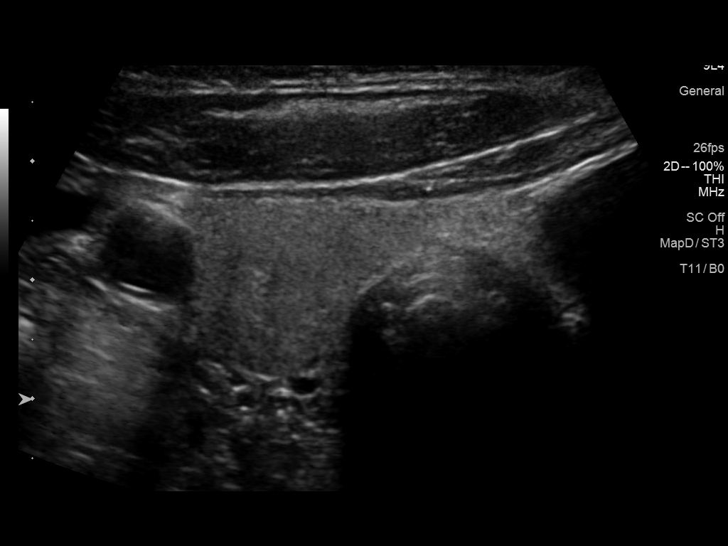
[im 10/40]
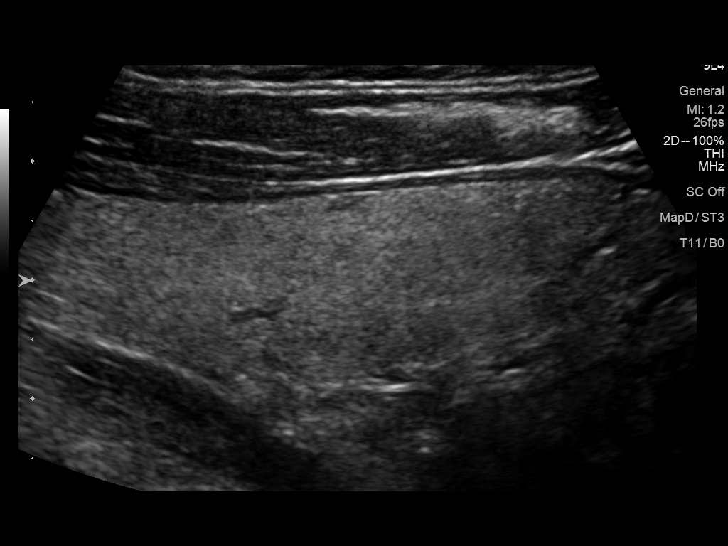
[im 14/40]
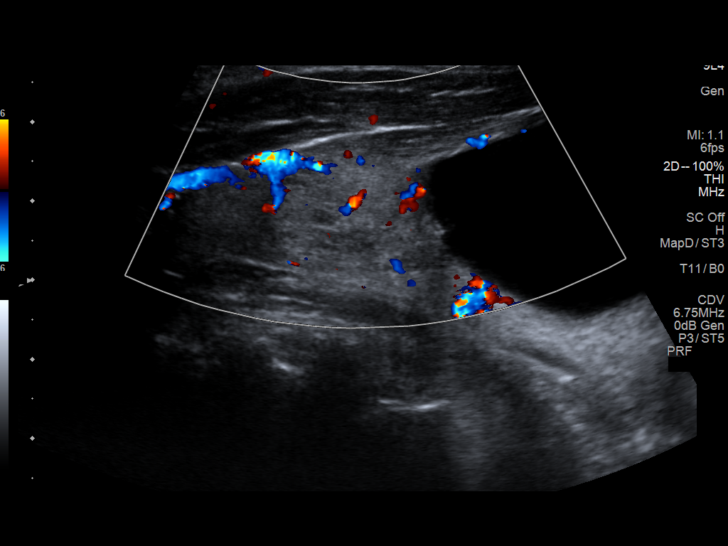
[im 17/40]
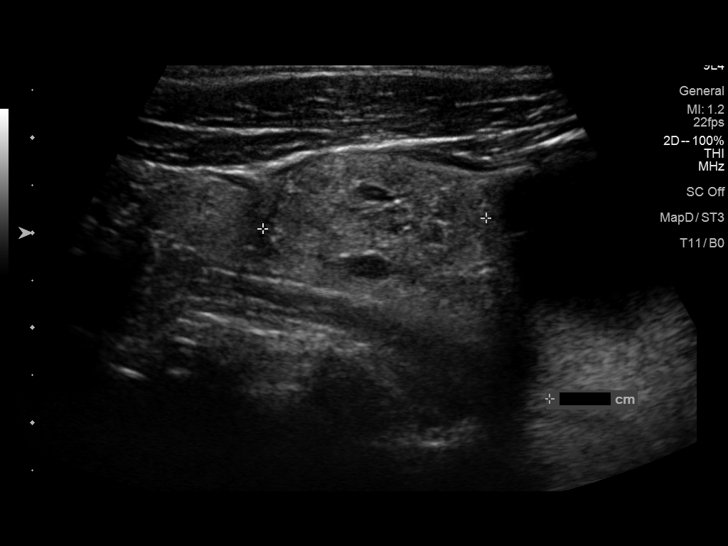
[im 20/40]
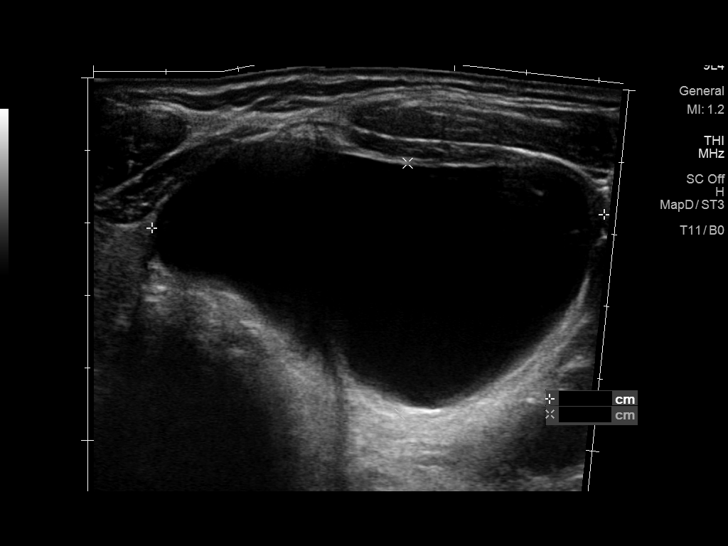
[im 23/40]
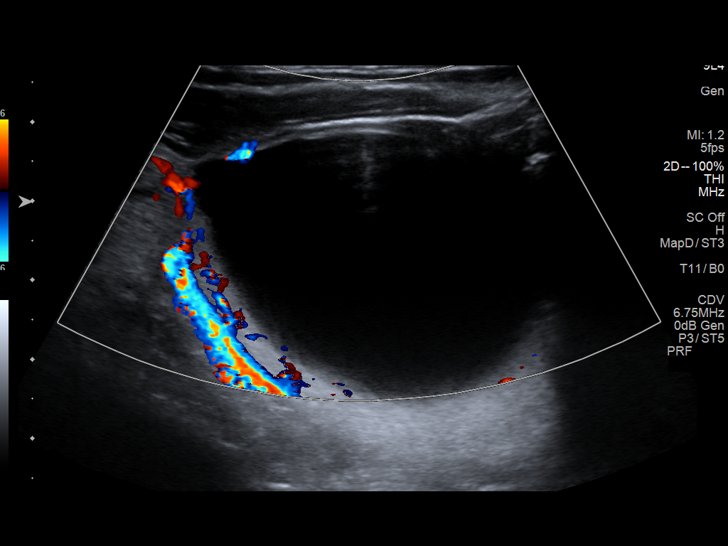
[im 27/40]
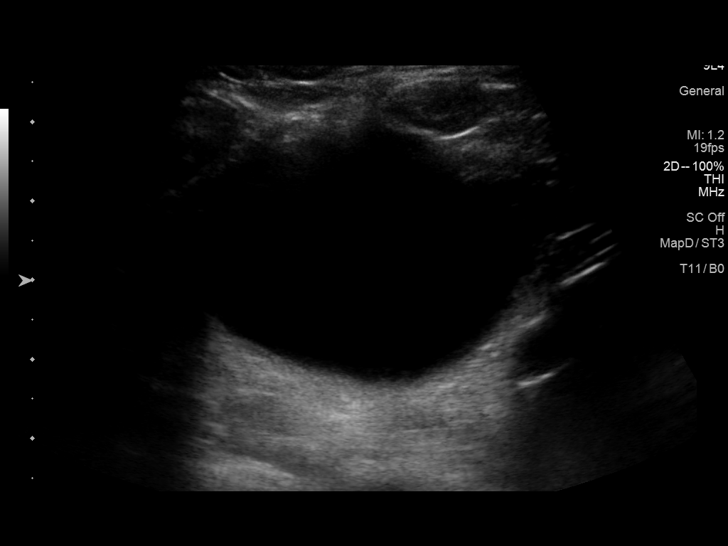
[im 30/40]
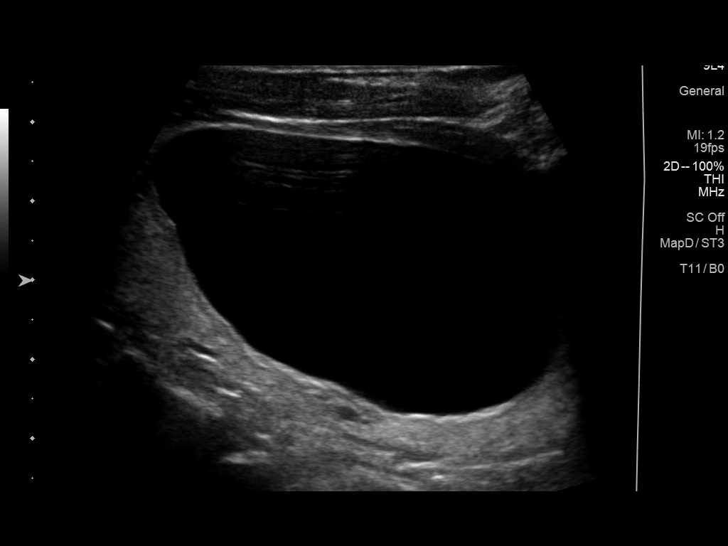
[im 33/40]
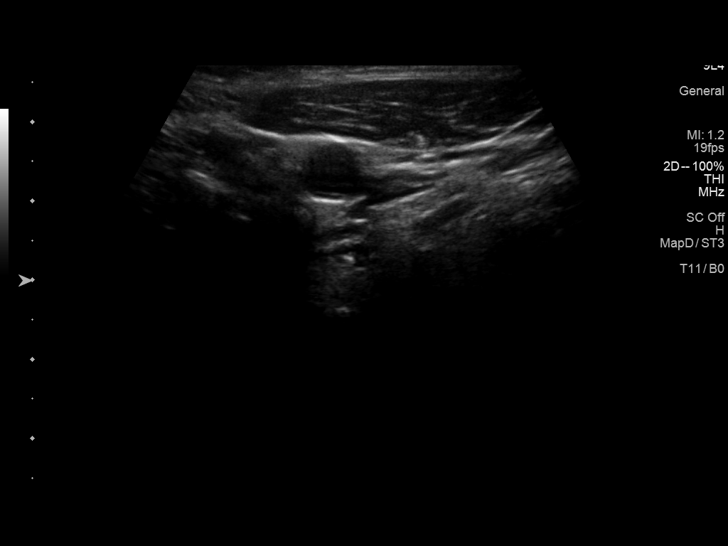
[im 36/40]
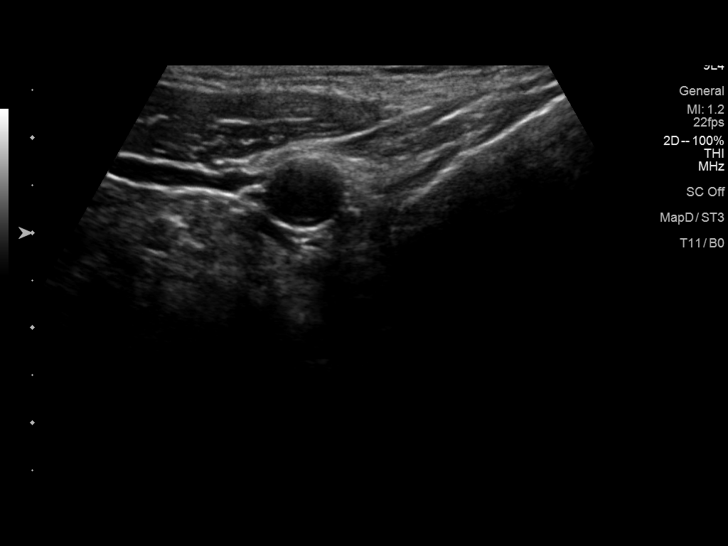
[im 40/40]
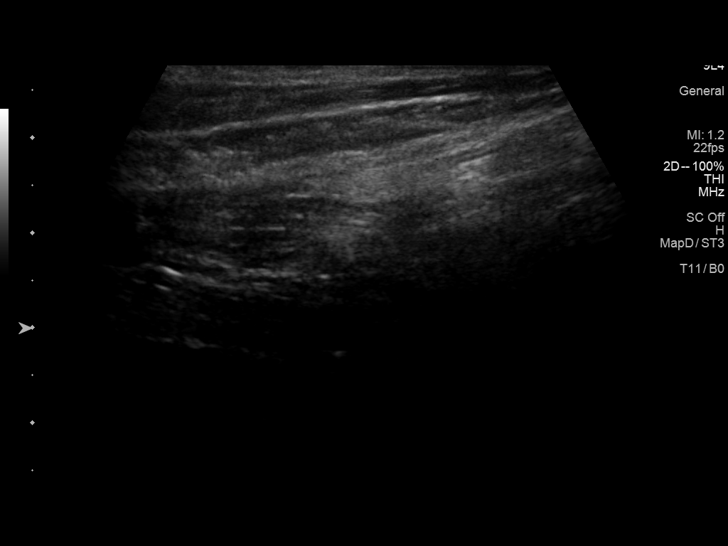

[13 of 25 positions shown; findings below may reference images not displayed]

FINDINGS: Parenchymal Echotexture: Mildly heterogenous

Isthmus: 0.6 cm

Right lobe: 6.0 x 1.8 x 2.0 cm

Left lobe: 8.5 x 4.6 x 5.0 cm

_________________________________________________________

Estimated total number of nodules >/= 1 cm: 2

Number of spongiform nodules >/=  2 cm not described below (TR1): 0

Number of mixed cystic and solid nodules >/= 1.5 cm not described
below (TR2): 0

_________________________________________________________

Nodule # 1:

Prior biopsy: No

Location: Right; Superior

Maximum size: 2.1 cm; Other 2 dimensions: 1.7 x 1.4 cm, previously,
1.9 x 1.9 x 1.7 cm

Composition: solid/almost completely solid (2)

Echogenicity: isoechoic (1)

Shape: not taller-than-wide (0)

Margins: ill-defined (0)

Echogenic foci: none (0)

ACR TI-RADS total points: 4.

ACR TI-RADS risk category:  TR3 (3 points).

Significant change in size (>/= 20% in two dimensions and minimal
increase of 2 mm): No

Change in features: No

Change in ACR TI-RADS risk category: No

ACR TI-RADS recommendations:

*Given size (>/= 1.5 - 2.4 cm) and appearance, a follow-up
ultrasound in 1 year should be considered based on TI-RADS criteria.

_________________________________________________________

Large anechoic simple cyst occupies nearly the entirety of the left
mid and lower gland and measures 6.3 x 6.2 x 3.5 cm. This is
slightly larger than previously measured.
IMPRESSION: 1. Slightly enlarged simple cyst occupies the majority of the left
mid and lower gland. If clinically symptomatic, aspiration could be
considered.
2. Continued stability of left upper pole TI-RADS category 3 nodule
dating back to Thursday August, 2018 confirming 3 year stability.
Recommend 1 additional follow-up ultrasound in 2 years (August 2023) to confirm 5 year stability and thus benignity.

The above is in keeping with the ACR TI-RADS recommendations - [HOSPITAL] 9429;[DATE].

## 2022-07-08 ENCOUNTER — Ambulatory Visit (HOSPITAL_COMMUNITY)
Admission: EM | Admit: 2022-07-08 | Discharge: 2022-07-08 | Disposition: A | Discharge status: Home / Self Care | Payer: Medicare HMO

## 2022-07-08 ENCOUNTER — Encounter (HOSPITAL_COMMUNITY): Payer: Self-pay

## 2022-07-08 DIAGNOSIS — Z20822 Contact with and (suspected) exposure to covid-19: Secondary | ICD-10-CM | POA: Diagnosis not present

## 2022-07-08 NOTE — ED Triage Notes (Addendum)
Pt presents with c/o  covid exposure while visiting a friend in a nursing home yesterday. Pt states he was notified that his friend tested positive today. Pt denies sx.

## 2022-07-08 NOTE — ED Provider Notes (Signed)
[facility]    CSN: [id number] Arrival date & time: 07/08/22  1826      History   Chief Complaint Chief Complaint  Patient presents with   Covid Exposure    HPI Eric Tyler is a 76 y.o. male.   Patient presents for COVID testing after known exposure 1 day ago from work and in nursing home he was visiting.  Denies all current symptoms.  History of hyperlipidemia, prediabetes, leukopenia.  Past Medical History:  Diagnosis Date   Allergy    Hyperlipidemia    Memory loss    Prediabetes     Patient Active Problem List   Diagnosis Date Noted   Arcus senilis of both eyes 07/09/2021   Atherosclerotic heart disease of native coronary artery without angina pectoris 07/09/2021   Body mass index (BMI) 27.0-27.9, adult 07/09/2021   Eczema 07/09/2021   Leukopenia 07/09/2021   Mild cognitive disorder 07/09/2021   Non-toxic multinodular goiter 07/09/2021   Pure hypercholesterolemia 07/09/2021   Bilateral hand pain 07/09/2021   Bilateral foot pain 07/09/2021   Abnormal nuclear stress test 05/02/2020   Edema 05/02/2020   Mild cognitive impairment 10/04/2018    Past Surgical History:  Procedure Laterality Date   FINGER SURGERY     Right thumb   [facility] MYOVIEW LTD  08/2018   Myoview: EF 48% with global HK.  Mild reversibility involving the mid anteroseptal wall suspicious for ischemia.  Intermediate risk.       Home Medications    Prior to Admission medications   Medication Sig Start Date End Date Taking? Authorizing Provider  Acetaminophen (TYLENOL 8 HOUR ARTHRITIS PAIN PO) Take 1 tablet by mouth 3 (three) times daily as needed. Patient not taking: No sig reported 09/24/17   [provider]  aspirin EC 81 MG tablet Take 81 mg by mouth daily.    [provider]  cetirizine (ZYRTEC) 10 MG tablet Take 1 tablet (10 mg total) by mouth daily for 10 days. 11/05/17 05/02/20  Horald Pollen, MD  clobetasol ointment (TEMOVATE) 0.05 %  SMARTSIG:Sparingly Topical Twice Daily 07/07/21   [provider]  donepezil (ARICEPT) 10 MG tablet TAKE 1 TABLET BY MOUTH EVERYDAY AT BEDTIME 10/16/20   Suzzanne Cloud, NP  Ferrous Sulfate (IRON) 325 (65 Fe) MG TABS 1 tablet    [provider]  furosemide (LASIX) 20 MG tablet Take 20 mg by mouth daily. 05/12/21   [provider]  ibuprofen (ADVIL) 600 MG tablet Take 1 tablet (600 mg total) by mouth every 6 (six) hours as needed. 07/28/21   Pearson Forster, NP  lidocaine (XYLOCAINE) 5 % ointment Apply 1 application topically as needed. 07/28/21   Pearson Forster, NP  metoprolol succinate (TOPROL-XL) 25 MG 24 hr tablet Take 1 tablet by mouth daily.    [provider]  Multiple Vitamin (MULTIVITAMIN WITH MINERALS) TABS tablet Take 1 tablet by mouth daily.    [provider]  omega-3 acid ethyl esters (LOVAZA) 1 G capsule Take 1 g by mouth 2 (two) times daily. Patient not taking: No sig reported    [provider]  Risankizumab-rzaa,150 MG Dose, (SKYRIZI, 150 MG DOSE,) 75 MG/0.83ML PSKT Inject into the skin. Prescribed by a different provider    [provider]    Family History Family History  Problem Relation Age of Onset   Cancer Mother    Hypertension Mother    Hypertension Father    Hyperlipidemia Father  Hypertension Son     Social History Social History   Tobacco Use   Smoking status: Never   Smokeless tobacco: Never  Vaping Use   Vaping Use: Never used  Substance Use Topics   Alcohol use: No    Alcohol/week: 0.0 standard drinks of alcohol   Drug use: No     Allergies   Patient has no known allergies.   Review of Systems Review of Systems  Constitutional: Negative.   HENT: Negative.    Respiratory: Negative.    Cardiovascular: Negative.   Skin: Negative.   Neurological: Negative.      Physical Exam Triage Vital Signs ED Triage Vitals [07/08/22 1845]  Enc Vitals Group     BP (!) 156/82     Pulse  Rate 62     Resp 14     Temp 98.2 F (36.8 C)     Temp Source Oral     SpO2 96 %     Weight      Height      Head Circumference      Peak Flow      Pain Score      Pain Loc      Pain Edu?      Excl. in [place]?    No data found.  Updated Vital Signs BP (!) 156/82 (BP Location: Right Arm)   Pulse 62   Temp 98.2 F (36.8 C) (Oral)   Resp 14   SpO2 96%   Visual Acuity Right Eye Distance:   Left Eye Distance:   Bilateral Distance:    Right Eye Near:   Left Eye Near:    Bilateral Near:     Physical Exam Vitals and nursing note reviewed.  Constitutional:      General: He is not in acute distress.    Appearance: He is well-developed.  HENT:     Head: Normocephalic and atraumatic.  Eyes:     Extraocular Movements: Extraocular movements intact.  Cardiovascular:     Rate and Rhythm: Normal rate and regular rhythm.     Pulses: Normal pulses.     Heart sounds: Normal heart sounds. No murmur heard. Pulmonary:     Effort: Pulmonary effort is normal. No respiratory distress.     Breath sounds: Normal breath sounds.  Abdominal:     Palpations: Abdomen is soft.     Tenderness: There is no abdominal tenderness.  Musculoskeletal:        General: No swelling.     Cervical back: Neck supple.  Skin:    General: Skin is warm and dry.     Capillary Refill: Capillary refill takes less than 2 seconds.  Neurological:     Mental Status: He is alert.  Psychiatric:        Mood and Affect: Mood normal.      UC Treatments / Results  Labs (all labs ordered are listed, but only abnormal results are displayed) Labs Reviewed - No data to display  EKG   Radiology No results found.  Procedures Procedures (including critical care time)  Medications Ordered in UC Medications - No data to display  Initial Impression / Assessment and Plan / UC Course  I have reviewed the triage vital signs and the nursing notes.  Pertinent labs & imaging results that were available during my  care of the patient were reviewed by me and considered in my medical decision making (see chart for details).  Exposure to COVID-19 virus, encounter for laboratory  testing for COVID 19  COVID test pending, will prescribe antivirals if positive, discussed quarantine and symptomatic treatment, last exposure was 1 day ago was discussed with patient that he may not begin to have symptoms for up to 5 days and if testing is negative he will need to be retested if symptoms begin to occur Final Clinical Impressions(s) / UC Diagnoses   Final diagnoses:  Exposure to COVID-19 virus  Encounter for laboratory testing for COVID-19 virus     Discharge Instructions      COVID test is pending for up to 24 hours, you will be notified of positive test results only  As your exposure occurred 1 day ago, if testing is negative and you begin to have symptoms within the next 5 days you will need to be retested as symptoms may begin up to 5 days after exposure  If positive antiviral treatment will be sent to pharmacy, this medication is to ideally reduce the severity and timeline of your illness  If positive for COVID you will need to quarantine for 5 days beginning from today, may resume normal activity on Monday, July 13, 2022, if you have begun to have symptoms at that point please wear mask  You will take medication every morning and every evening for the next 5 days  You may use over-the-counter medication as needed for support if symptoms begin, may attempt any of the following below    You can take Tylenol and/or Ibuprofen as needed for fever reduction and pain relief.   For cough: honey 1/2 to 1 teaspoon (you can dilute the honey in water or another fluid).  You can also use guaifenesin and dextromethorphan for cough. You can use a humidifier for chest congestion and cough.  If you don't have a humidifier, you can sit in the bathroom with the hot shower running.      For sore throat: try warm salt  water gargles, cepacol lozenges, throat spray, warm tea or water with lemon/honey, popsicles or ice, or OTC cold relief medicine for throat discomfort.   For congestion: take a daily anti-histamine like Zyrtec, Claritin, and a oral decongestant, such as pseudoephedrine.  You can also use Flonase 1-2 sprays in each nostril daily.   It is important to stay hydrated: drink plenty of fluids (water, gatorade/powerade/pedialyte, juices, or teas) to keep your throat moisturized and help further relieve irritation/discomfort.    ED Prescriptions   None    PDMP not reviewed this encounter.   Hans Eden, NP 07/08/22 1859

## 2022-07-08 NOTE — Discharge Instructions (Addendum)
COVID test is pending for up to 24 hours, you will be notified of positive test results only  As your exposure occurred 1 day ago, if testing is negative and you begin to have symptoms within the next 5 days you will need to be retested as symptoms may begin up to 5 days after exposure  If positive antiviral treatment will be sent to pharmacy, this medication is to ideally reduce the severity and timeline of your illness  If positive for COVID you will need to quarantine for 5 days beginning from today, may resume normal activity on Monday, July 13, 2022, if you have begun to have symptoms at that point please wear mask  You will take medication every morning and every evening for the next 5 days  You may use over-the-counter medication as needed for support if symptoms begin, may attempt any of the following below    You can take Tylenol and/or Ibuprofen as needed for fever reduction and pain relief.   For cough: honey 1/2 to 1 teaspoon (you can dilute the honey in water or another fluid).  You can also use guaifenesin and dextromethorphan for cough. You can use a humidifier for chest congestion and cough.  If you don't have a humidifier, you can sit in the bathroom with the hot shower running.      For sore throat: try warm salt water gargles, cepacol lozenges, throat spray, warm tea or water with lemon/honey, popsicles or ice, or OTC cold relief medicine for throat discomfort.   For congestion: take a daily anti-histamine like Zyrtec, Claritin, and a oral decongestant, such as pseudoephedrine.  You can also use Flonase 1-2 sprays in each nostril daily.   It is important to stay hydrated: drink plenty of fluids (water, gatorade/powerade/pedialyte, juices, or teas) to keep your throat moisturized and help further relieve irritation/discomfort.

## 2022-07-10 ENCOUNTER — Ambulatory Visit (HOSPITAL_COMMUNITY)
Admission: EM | Admit: 2022-07-10 | Discharge: 2022-07-10 | Disposition: A | Discharge status: Home / Self Care | Payer: Medicare HMO | Attending: Internal Medicine | Admitting: Internal Medicine

## 2022-07-10 ENCOUNTER — Telehealth (HOSPITAL_COMMUNITY): Payer: Self-pay | Admitting: Emergency Medicine

## 2022-07-10 DIAGNOSIS — Z20822 Contact with and (suspected) exposure to covid-19: Secondary | ICD-10-CM | POA: Diagnosis not present

## 2022-07-10 DIAGNOSIS — Z202 Contact with and (suspected) exposure to infections with a predominantly sexual mode of transmission: Secondary | ICD-10-CM

## 2022-07-10 NOTE — ED Triage Notes (Signed)
Pt presents to UC to have his covid swab recollected. Advised his results would come back 2 hrs after the lab receives the test.

## 2022-07-10 NOTE — Telephone Encounter (Signed)
Patient called looking for results of COVID test.  Upon chart review, test was not ordered.  However, per provider note, it was intended.  Called down to lab to see if they had swab there, none received.  Called patient and updated him.  He will return today for  NURSE VISIT with a 2 [place] test due to the delay.  He verbalized understanding.

## 2022-07-11 LAB — SARS CORONAVIRUS 2 BY RT PCR: SARS Coronavirus 2 by RT PCR: NEGATIVE

## 2022-07-18 ENCOUNTER — Emergency Department (HOSPITAL_BASED_OUTPATIENT_CLINIC_OR_DEPARTMENT_OTHER)
Admission: EM | Admit: 2022-07-18 | Discharge: 2022-07-18 | Disposition: A | Discharge status: Home / Self Care | Payer: Medicare HMO | Attending: Emergency Medicine | Admitting: Emergency Medicine

## 2022-07-18 ENCOUNTER — Other Ambulatory Visit: Payer: Self-pay

## 2022-07-18 ENCOUNTER — Emergency Department (HOSPITAL_BASED_OUTPATIENT_CLINIC_OR_DEPARTMENT_OTHER): Payer: Medicare HMO

## 2022-07-18 DIAGNOSIS — Z79899 Other long term (current) drug therapy: Secondary | ICD-10-CM | POA: Diagnosis not present

## 2022-07-18 DIAGNOSIS — R112 Nausea with vomiting, unspecified: Secondary | ICD-10-CM | POA: Insufficient documentation

## 2022-07-18 DIAGNOSIS — R1110 Vomiting, unspecified: Secondary | ICD-10-CM | POA: Diagnosis not present

## 2022-07-18 DIAGNOSIS — K5900 Constipation, unspecified: Secondary | ICD-10-CM | POA: Diagnosis not present

## 2022-07-18 DIAGNOSIS — R1084 Generalized abdominal pain: Secondary | ICD-10-CM | POA: Diagnosis present

## 2022-07-18 DIAGNOSIS — Z7982 Long term (current) use of aspirin: Secondary | ICD-10-CM | POA: Insufficient documentation

## 2022-07-18 DIAGNOSIS — I700 Atherosclerosis of aorta: Secondary | ICD-10-CM | POA: Diagnosis not present

## 2022-07-18 DIAGNOSIS — R109 Unspecified abdominal pain: Secondary | ICD-10-CM | POA: Diagnosis not present

## 2022-07-18 LAB — CBC
HCT: 40.9 % (ref 39.0–52.0)
Hemoglobin: 13.4 g/dL (ref 13.0–17.0)
MCH: 31.5 pg (ref 26.0–34.0)
MCHC: 32.8 g/dL (ref 30.0–36.0)
MCV: 96 fL (ref 80.0–100.0)
Platelets: 290 10*3/uL (ref 150–400)
RBC: 4.26 MIL/uL (ref 4.22–5.81)
RDW: 12.7 % (ref 11.5–15.5)
WBC: 3.2 10*3/uL — ABNORMAL LOW (ref 4.0–10.5)
nRBC: 0 % (ref 0.0–0.2)

## 2022-07-18 LAB — COMPREHENSIVE METABOLIC PANEL
ALT: 10 U/L (ref 0–44)
AST: 15 U/L (ref 15–41)
Albumin: 4.3 g/dL (ref 3.5–5.0)
Alkaline Phosphatase: 45 U/L (ref 38–126)
Anion gap: 8 (ref 5–15)
BUN: 14 mg/dL (ref 8–23)
CO2: 28 mmol/L (ref 22–32)
Calcium: 9.7 mg/dL (ref 8.9–10.3)
Chloride: 101 mmol/L (ref 98–111)
Creatinine, Ser: 1 mg/dL (ref 0.61–1.24)
GFR, Estimated: >60 mL/min (ref >60)
Glucose, Bld: 133 mg/dL — ABNORMAL HIGH (ref 70–99)
Potassium: 4.5 mmol/L (ref 3.5–5.1)
Sodium: 137 mmol/L (ref 135–145)
Total Bilirubin: 0.5 mg/dL (ref 0.3–1.2)
Total Protein: 7.3 g/dL (ref 6.5–8.1)

## 2022-07-18 LAB — LIPASE, BLOOD: Lipase: 25 U/L (ref 11–51)

## 2022-07-18 MED ORDER — ONDANSETRON HCL 4 MG/2ML IJ SOLN
4.0000 mg | Freq: Once | INTRAMUSCULAR | Status: AC
  Administered 2022-07-18: 4 mg via INTRAVENOUS
  Filled 2022-07-18: qty 2

## 2022-07-18 MED ORDER — IOHEXOL 300 MG/ML  SOLN
100.0000 mL | Freq: Once | INTRAMUSCULAR | Status: AC | PRN
  Administered 2022-07-18: 100 mL via INTRAVENOUS

## 2022-07-18 MED ORDER — SODIUM CHLORIDE 0.9 % IV BOLUS
1000.0000 mL | Freq: Once | INTRAVENOUS | Status: AC
  Administered 2022-07-18: 1000 mL via INTRAVENOUS

## 2022-07-18 MED ORDER — POLYETHYLENE GLYCOL 3350 17 G PO PACK
17.0000 g | PACK | Freq: Every day | ORAL | Status: DC
  Administered 2022-07-18: 17 g via ORAL
  Filled 2022-07-18: qty 1

## 2022-07-18 MED ORDER — ONDANSETRON 8 MG PO TBDP: 8.0000 mg | ORAL_TABLET | Freq: Three times a day (TID) | ORAL | 0 refills | Status: AC | PRN

## 2022-07-18 NOTE — Discharge Instructions (Signed)
Continue giving Mr. Eric Tyler for constipation.  Ensure he is hydrating well. Nausea medicine also prescribed.  Please return to the ER if your symptoms worsen; you have increased pain, fevers, chills, inability to keep any medications down, confusion. Otherwise see the outpatient doctor as requested.

## 2022-07-18 NOTE — ED Triage Notes (Addendum)
Patient arrives with complaints of vomiting and constipation x2 days.   Patient also reports some abdominal discomfort. Patient is currently being treated for skin cancer (melanoma). Currently taking oral pills for treatment.  Accompanied by Wife.

## 2022-07-18 NOTE — ED Provider Notes (Signed)
[facility] DE[facility] Provider Note   CSN: [id number] Arrival date & time: 07/18/22  8921     History  Chief Complaint  Patient presents with   Emesis   Constipation    Eric Tyler is a 76 y.o. male.  HPI    76 year old male comes in with chief complaint of nausea, vomiting and constipation.  Patient here with his wife.  Patient states that over the last 2 or 3 days he has been having nausea with vomiting.  Emesis is bilious.  He also has not had normal bowel movement.  His last bowel movement was 2 days ago.  Now when he goes to the bathroom, there is watery stool and lots of gas.  Patient has taken constipation medications over the last 24 hours, without any significant change prompting him to come to the ER.  Patient's pain is generalized.  He denies any burning with urination, blood in the urine.   Home Medications Prior to Admission medications   Medication Sig Start Date End Date Taking? Authorizing Provider  ondansetron (ZOFRAN-ODT) 8 MG disintegrating tablet Take 1 tablet (8 mg total) by mouth every 8 (eight) hours as needed for nausea. 07/18/22  Yes Varney Biles, MD  Acetaminophen (TYLENOL 8 HOUR ARTHRITIS PAIN PO) Take 1 tablet by mouth 3 (three) times daily as needed. Patient not taking: No sig reported 09/24/17   [provider]  aspirin EC 81 MG tablet Take 81 mg by mouth daily.    [provider]  cetirizine (ZYRTEC) 10 MG tablet Take 1 tablet (10 mg total) by mouth daily for 10 days. 11/05/17 05/02/20  Horald Pollen, MD  clobetasol ointment (TEMOVATE) 0.05 % SMARTSIG:Sparingly Topical Twice Daily 07/07/21   [provider]  donepezil (ARICEPT) 10 MG tablet TAKE 1 TABLET BY MOUTH EVERYDAY AT BEDTIME 10/16/20   Suzzanne Cloud, NP  Ferrous Sulfate (IRON) 325 (65 Fe) MG TABS 1 tablet    [provider]  furosemide (LASIX) 20 MG tablet Take 20 mg by mouth daily. 05/12/21   [provider]   ibuprofen (ADVIL) 600 MG tablet Take 1 tablet (600 mg total) by mouth every 6 (six) hours as needed. 07/28/21   Pearson Forster, NP  lidocaine (XYLOCAINE) 5 % ointment Apply 1 application topically as needed. 07/28/21   Pearson Forster, NP  metoprolol succinate (TOPROL-XL) 25 MG 24 hr tablet Take 1 tablet by mouth daily.    [provider]  Multiple Vitamin (MULTIVITAMIN WITH MINERALS) TABS tablet Take 1 tablet by mouth daily.    [provider]  omega-3 acid ethyl esters (LOVAZA) 1 G capsule Take 1 g by mouth 2 (two) times daily. Patient not taking: No sig reported    [provider]  Risankizumab-rzaa,150 MG Dose, (SKYRIZI, 150 MG DOSE,) 75 MG/0.83ML PSKT Inject into the skin. Prescribed by a different provider    [provider]      Allergies    Patient has no known allergies.    Review of Systems   Review of Systems  All other systems reviewed and are negative.   Physical Exam Updated Vital Signs BP (!) 162/80   Pulse (!) 47   Temp 97.7 F (36.5 C)   Resp 12   Ht '5\' 11"'$  (1.803 m)   Wt 91.4 kg   SpO2 100%   BMI 28.10 kg/m  Physical Exam Vitals and nursing note reviewed.  Constitutional:      Appearance: He is well-developed.  HENT:     Head: Atraumatic.  Cardiovascular:     Rate and Rhythm: Normal rate.  Pulmonary:     Effort: Pulmonary effort is normal.  Abdominal:     General: There is distension.     Tenderness: There is abdominal tenderness. There is no guarding or rebound.  Musculoskeletal:     Cervical back: Neck supple.  Skin:    General: Skin is warm.  Neurological:     Mental Status: He is alert and oriented to person, place, and time.     ED Results / Procedures / Treatments   Labs (all labs ordered are listed, but only abnormal results are displayed) Labs Reviewed  COMPREHENSIVE METABOLIC PANEL - Abnormal; Notable for the following components:      Result Value   Glucose, Bld 133 (*)    All other components  within normal limits  CBC - Abnormal; Notable for the following components:   WBC 3.2 (*)    All other components within normal limits  LIPASE, BLOOD  URINALYSIS, ROUTINE W REFLEX MICROSCOPIC    EKG EKG Interpretation  Date/Time:  Saturday July 18 2022 09:46:10 EDT Ventricular Rate:  53 PR Interval:  191 QRS Duration: 99 QT Interval:  470 QTC Calculation: 442 R Axis:   21 Text Interpretation: Sinus rhythm Anterior infarct, old No acute changes No old tracing to compare Confirmed by Varney Biles [phone]) on 07/18/2022 2:17:39 PM  Radiology CT ABDOMEN PELVIS W CONTRAST  Result Date: 07/18/2022 CLINICAL DATA:  Abdominal pain with nausea vomiting and constipation for 2 days. EXAM: CT ABDOMEN AND PELVIS WITH CONTRAST TECHNIQUE: Multidetector CT imaging of the abdomen and pelvis was performed using the standard protocol following bolus administration of intravenous contrast. RADIATION DOSE REDUCTION: This exam was performed according to the departmental dose-optimization program which includes automated exposure control, adjustment of the mA and/or kV according to patient size and/or use of iterative reconstruction technique. CONTRAST:  167m OMNIPAQUE IOHEXOL 300 MG/ML  SOLN COMPARISON:  10/18/2015. FINDINGS: Lower chest: Clear lung bases. Hepatobiliary: No focal liver abnormality is seen. No gallstones, gallbladder wall thickening, or biliary dilatation. Pancreas: Unremarkable. No pancreatic ductal dilatation or surrounding inflammatory changes. Spleen: Normal in size without focal abnormality. Adrenals/Urinary Tract: Adrenal glands are unremarkable. Kidneys are normal, without renal calculi, focal lesion, or hydronephrosis. Bladder is unremarkable. Stomach/Bowel: Normal stomach. Small bowel and colon are normal in caliber. No wall thickening. No inflammation. Left colon diverticula. Mild overall colonic stool burden. Normal appendix visualized. Vascular/Lymphatic: Minimal aortic  atherosclerosis. No aneurysm. Mild left common iliac artery atherosclerosis. No enlarged lymph nodes. Reproductive: Prostate mildly enlarged, 5.2 x 3.7 cm. Other: No significant hernia.  No ascites. Musculoskeletal: No fracture or acute finding.  No bone lesion. IMPRESSION: 1. No acute findings within the abdomen or pelvis. 2. No evidence of bowel obstruction or inflammation. Mild overall colonic stool burden. Electronically Signed   By: DLajean ManesM.D.   On: 07/18/2022 11:38    Procedures Procedures    Medications Ordered in ED Medications  polyethylene glycol (MIRALAX / GLYCOLAX) packet 17 g (17 g Oral Given 07/18/22 1316)  ondansetron (ZOFRAN) injection 4 mg (4 mg Intravenous Given 07/18/22 1152)  sodium chloride 0.9 % bolus 1,000 mL (1,000 mLs Intravenous New Bag/Given 07/18/22 1153)  iohexol (OMNIPAQUE) 300 MG/ML solution 100 mL (100 mLs Intravenous Contrast Given 07/18/22 1116)    ED Course/ Medical Decision Making/ A&P  Medical Decision Making Amount and/or Complexity of Data Reviewed Labs: ordered. Radiology: ordered.  Risk Prescription drug management.   This patient presents to the ED with chief complaint(s) of abdominal pain, nausea, vomiting and constipation concerns with pertinent past medical history of hyperlipidemia.The complaint involves an extensive differential diagnosis and also carries with it a high risk of complications and morbidity.    Patient has no abdominal surgical history.  His abdomen is soft, but distended and there is generalized tenderness.  The differential diagnosis includes small bowel obstruction, ileus, severe constipation, fecal impaction, obstructive tumor  The initial plan is to get basic labs and CT abdomen pelvis with contrast.   Additional history obtained: Additional history obtained from spouse -who provides substantive part of the history  Independent labs interpretation:  The following labs were  independently interpreted: CBC, metabolic profile are overall reassuring  Independent visualization and interpretation of imaging: - I independently visualized the following imaging with scope of interpretation limited to determining acute life threatening conditions related to emergency care: CT abdomen pelvis, which revealed no evidence of significant bowel distention or free air/perforation.  Treatment and Reassessment: Results of the ER work-up discussed with the patient.  He has passed oral challenge here.  MiraLAX given.  Advised family to ensure patient hydrates well and gets MiraLAX for the next few days.  Nausea medications provided.  Return precautions discussed.  Family comfortable with the plan.    Complexity of problems addressed:  4: acute illness w/systemic sx (f)   Final Clinical Impression(s) / ED Diagnoses Final diagnoses:  Generalized abdominal pain  Constipation, unspecified constipation type    Rx / DC Orders ED Discharge Orders          Ordered    ondansetron (ZOFRAN-ODT) 8 MG disintegrating tablet  Every 8 hours PRN        07/18/22 1416              Varney Biles, MD 07/18/22 1515

## 2022-07-28 ENCOUNTER — Other Ambulatory Visit: Payer: Self-pay | Admitting: Home Modifications

## 2022-07-28 DIAGNOSIS — E042 Nontoxic multinodular goiter: Secondary | ICD-10-CM

## 2022-07-30 DIAGNOSIS — C8400 Mycosis fungoides, unspecified site: Secondary | ICD-10-CM | POA: Diagnosis not present

## 2022-07-30 DIAGNOSIS — Z79899 Other long term (current) drug therapy: Secondary | ICD-10-CM | POA: Diagnosis not present

## 2022-07-30 DIAGNOSIS — Z7969 Long term (current) use of other immunomodulators and immunosuppressants: Secondary | ICD-10-CM | POA: Diagnosis not present

## 2022-07-30 DIAGNOSIS — R5383 Other fatigue: Secondary | ICD-10-CM | POA: Diagnosis not present

## 2022-08-26 ENCOUNTER — Ambulatory Visit
Admission: RE | Admit: 2022-08-26 | Discharge: 2022-08-26 | Disposition: A | Discharge status: Home / Self Care | Payer: Medicare HMO | Source: Ambulatory Visit | Attending: Home Modifications | Admitting: Home Modifications

## 2022-08-26 DIAGNOSIS — E041 Nontoxic single thyroid nodule: Secondary | ICD-10-CM | POA: Diagnosis not present

## 2022-08-26 DIAGNOSIS — E042 Nontoxic multinodular goiter: Secondary | ICD-10-CM

## 2022-08-28 DIAGNOSIS — Z5181 Encounter for therapeutic drug level monitoring: Secondary | ICD-10-CM | POA: Diagnosis not present

## 2022-08-28 DIAGNOSIS — Z79899 Other long term (current) drug therapy: Secondary | ICD-10-CM | POA: Diagnosis not present

## 2022-08-28 DIAGNOSIS — C8400 Mycosis fungoides, unspecified site: Secondary | ICD-10-CM | POA: Diagnosis not present

## 2022-09-04 DIAGNOSIS — E785 Hyperlipidemia, unspecified: Secondary | ICD-10-CM | POA: Diagnosis not present

## 2022-09-04 DIAGNOSIS — R7303 Prediabetes: Secondary | ICD-10-CM | POA: Diagnosis not present

## 2022-09-04 DIAGNOSIS — I10 Essential (primary) hypertension: Secondary | ICD-10-CM | POA: Diagnosis not present

## 2022-10-15 DIAGNOSIS — D72819 Decreased white blood cell count, unspecified: Secondary | ICD-10-CM | POA: Diagnosis not present

## 2022-10-15 DIAGNOSIS — I2510 Atherosclerotic heart disease of native coronary artery without angina pectoris: Secondary | ICD-10-CM | POA: Diagnosis not present

## 2022-10-15 DIAGNOSIS — R7303 Prediabetes: Secondary | ICD-10-CM | POA: Diagnosis not present

## 2022-10-15 DIAGNOSIS — D649 Anemia, unspecified: Secondary | ICD-10-CM | POA: Diagnosis not present

## 2022-10-15 DIAGNOSIS — Z125 Encounter for screening for malignant neoplasm of prostate: Secondary | ICD-10-CM | POA: Diagnosis not present

## 2022-10-20 DIAGNOSIS — E042 Nontoxic multinodular goiter: Secondary | ICD-10-CM | POA: Diagnosis not present

## 2022-10-20 DIAGNOSIS — Z0000 Encounter for general adult medical examination without abnormal findings: Secondary | ICD-10-CM | POA: Diagnosis not present

## 2022-10-20 DIAGNOSIS — I2510 Atherosclerotic heart disease of native coronary artery without angina pectoris: Secondary | ICD-10-CM | POA: Diagnosis not present

## 2022-10-20 DIAGNOSIS — Z23 Encounter for immunization: Secondary | ICD-10-CM | POA: Diagnosis not present

## 2022-10-20 DIAGNOSIS — R7303 Prediabetes: Secondary | ICD-10-CM | POA: Diagnosis not present

## 2022-10-20 DIAGNOSIS — D649 Anemia, unspecified: Secondary | ICD-10-CM | POA: Diagnosis not present

## 2022-10-20 DIAGNOSIS — G3184 Mild cognitive impairment, so stated: Secondary | ICD-10-CM | POA: Diagnosis not present

## 2022-10-20 DIAGNOSIS — C8400 Mycosis fungoides, unspecified site: Secondary | ICD-10-CM | POA: Diagnosis not present

## 2022-10-20 DIAGNOSIS — D72819 Decreased white blood cell count, unspecified: Secondary | ICD-10-CM | POA: Diagnosis not present

## 2022-10-29 DIAGNOSIS — C8400 Mycosis fungoides, unspecified site: Secondary | ICD-10-CM | POA: Diagnosis not present

## 2022-10-29 DIAGNOSIS — C8409 Mycosis fungoides, extranodal and solid organ sites: Secondary | ICD-10-CM | POA: Diagnosis not present

## 2022-10-29 DIAGNOSIS — E781 Pure hyperglyceridemia: Secondary | ICD-10-CM | POA: Diagnosis not present

## 2022-10-29 DIAGNOSIS — Z79899 Other long term (current) drug therapy: Secondary | ICD-10-CM | POA: Diagnosis not present

## 2022-11-10 DIAGNOSIS — C8400 Mycosis fungoides, unspecified site: Secondary | ICD-10-CM | POA: Diagnosis not present

## 2022-11-10 DIAGNOSIS — Z5181 Encounter for therapeutic drug level monitoring: Secondary | ICD-10-CM | POA: Diagnosis not present

## 2022-11-10 DIAGNOSIS — Z79899 Other long term (current) drug therapy: Secondary | ICD-10-CM | POA: Diagnosis not present

## 2022-12-04 DIAGNOSIS — I10 Essential (primary) hypertension: Secondary | ICD-10-CM | POA: Diagnosis not present

## 2022-12-04 DIAGNOSIS — E785 Hyperlipidemia, unspecified: Secondary | ICD-10-CM | POA: Diagnosis not present

## 2022-12-04 DIAGNOSIS — R7303 Prediabetes: Secondary | ICD-10-CM | POA: Diagnosis not present

## 2022-12-04 DIAGNOSIS — F0390 Unspecified dementia without behavioral disturbance: Secondary | ICD-10-CM | POA: Diagnosis not present

## 2022-12-30 DIAGNOSIS — I2510 Atherosclerotic heart disease of native coronary artery without angina pectoris: Secondary | ICD-10-CM | POA: Diagnosis not present

## 2022-12-30 DIAGNOSIS — E7800 Pure hypercholesterolemia, unspecified: Secondary | ICD-10-CM | POA: Diagnosis not present

## 2023-01-05 ENCOUNTER — Inpatient Hospital Stay (HOSPITAL_COMMUNITY)
Admission: EM | Admit: 2023-01-05 | Discharge: 2023-01-07 | DRG: 194 | Disposition: A | Discharge status: Home / Self Care | Payer: Medicare HMO | Source: Ambulatory Visit | Attending: Family Medicine | Admitting: Family Medicine

## 2023-01-05 ENCOUNTER — Emergency Department (HOSPITAL_COMMUNITY): Payer: Medicare HMO

## 2023-01-05 ENCOUNTER — Encounter (HOSPITAL_COMMUNITY): Payer: Self-pay

## 2023-01-05 DIAGNOSIS — Z79899 Other long term (current) drug therapy: Secondary | ICD-10-CM | POA: Diagnosis not present

## 2023-01-05 DIAGNOSIS — R42 Dizziness and giddiness: Secondary | ICD-10-CM

## 2023-01-05 DIAGNOSIS — Z1152 Encounter for screening for COVID-19: Secondary | ICD-10-CM

## 2023-01-05 DIAGNOSIS — C8400 Mycosis fungoides, unspecified site: Secondary | ICD-10-CM | POA: Diagnosis present

## 2023-01-05 DIAGNOSIS — R0902 Hypoxemia: Secondary | ICD-10-CM | POA: Diagnosis present

## 2023-01-05 DIAGNOSIS — R112 Nausea with vomiting, unspecified: Secondary | ICD-10-CM

## 2023-01-05 DIAGNOSIS — R059 Cough, unspecified: Secondary | ICD-10-CM | POA: Diagnosis not present

## 2023-01-05 DIAGNOSIS — Z809 Family history of malignant neoplasm, unspecified: Secondary | ICD-10-CM | POA: Diagnosis not present

## 2023-01-05 DIAGNOSIS — T451X5A Adverse effect of antineoplastic and immunosuppressive drugs, initial encounter: Secondary | ICD-10-CM | POA: Diagnosis not present

## 2023-01-05 DIAGNOSIS — E032 Hypothyroidism due to medicaments and other exogenous substances: Secondary | ICD-10-CM | POA: Diagnosis present

## 2023-01-05 DIAGNOSIS — J690 Pneumonitis due to inhalation of food and vomit: Secondary | ICD-10-CM | POA: Diagnosis present

## 2023-01-05 DIAGNOSIS — R7303 Prediabetes: Secondary | ICD-10-CM | POA: Diagnosis present

## 2023-01-05 DIAGNOSIS — Z8249 Family history of ischemic heart disease and other diseases of the circulatory system: Secondary | ICD-10-CM

## 2023-01-05 DIAGNOSIS — J189 Pneumonia, unspecified organism: Principal | ICD-10-CM | POA: Diagnosis present

## 2023-01-05 DIAGNOSIS — G3184 Mild cognitive impairment, so stated: Secondary | ICD-10-CM | POA: Diagnosis present

## 2023-01-05 DIAGNOSIS — Z7982 Long term (current) use of aspirin: Secondary | ICD-10-CM | POA: Diagnosis not present

## 2023-01-05 DIAGNOSIS — R7989 Other specified abnormal findings of blood chemistry: Secondary | ICD-10-CM | POA: Diagnosis not present

## 2023-01-05 DIAGNOSIS — E785 Hyperlipidemia, unspecified: Secondary | ICD-10-CM | POA: Diagnosis not present

## 2023-01-05 DIAGNOSIS — R4189 Other symptoms and signs involving cognitive functions and awareness: Secondary | ICD-10-CM | POA: Diagnosis present

## 2023-01-05 DIAGNOSIS — R55 Syncope and collapse: Secondary | ICD-10-CM | POA: Diagnosis not present

## 2023-01-05 DIAGNOSIS — Z83438 Family history of other disorder of lipoprotein metabolism and other lipidemia: Secondary | ICD-10-CM | POA: Diagnosis not present

## 2023-01-05 DIAGNOSIS — R61 Generalized hyperhidrosis: Secondary | ICD-10-CM | POA: Diagnosis not present

## 2023-01-05 DIAGNOSIS — R1111 Vomiting without nausea: Secondary | ICD-10-CM | POA: Diagnosis not present

## 2023-01-05 DIAGNOSIS — R4182 Altered mental status, unspecified: Secondary | ICD-10-CM | POA: Diagnosis not present

## 2023-01-05 LAB — CBC WITH DIFFERENTIAL/PLATELET
Abs Immature Granulocytes: 0.02 10*3/uL (ref 0.00–0.07)
Basophils Absolute: 0 10*3/uL (ref 0.0–0.1)
Basophils Relative: 1 %
Eosinophils Absolute: 0.2 10*3/uL (ref 0.0–0.5)
Eosinophils Relative: 5 %
HCT: 37.8 % — ABNORMAL LOW (ref 39.0–52.0)
Hemoglobin: 12.4 g/dL — ABNORMAL LOW (ref 13.0–17.0)
Immature Granulocytes: 0 %
Lymphocytes Relative: 25 %
Lymphs Abs: 1.3 10*3/uL (ref 0.7–4.0)
MCH: 32.4 pg (ref 26.0–34.0)
MCHC: 32.8 g/dL (ref 30.0–36.0)
MCV: 98.7 fL (ref 80.0–100.0)
Monocytes Absolute: 0.4 10*3/uL (ref 0.1–1.0)
Monocytes Relative: 8 %
Neutro Abs: 3.1 10*3/uL (ref 1.7–7.7)
Neutrophils Relative %: 61 %
Platelets: 259 10*3/uL (ref 150–400)
RBC: 3.83 MIL/uL — ABNORMAL LOW (ref 4.22–5.81)
RDW: 14.1 % (ref 11.5–15.5)
WBC: 5.1 10*3/uL (ref 4.0–10.5)
nRBC: 0 % (ref 0.0–0.2)

## 2023-01-05 LAB — HEPATIC FUNCTION PANEL
ALT: 14 U/L (ref 0–44)
AST: 24 U/L (ref 15–41)
Albumin: 3.7 g/dL (ref 3.5–5.0)
Alkaline Phosphatase: 45 U/L (ref 38–126)
Bilirubin, Direct: 0.1 mg/dL (ref 0.0–0.2)
Indirect Bilirubin: 0 mg/dL — ABNORMAL LOW (ref 0.3–0.9)
Total Bilirubin: 0.1 mg/dL — ABNORMAL LOW (ref 0.3–1.2)
Total Protein: 6.5 g/dL (ref 6.5–8.1)

## 2023-01-05 LAB — URINALYSIS, ROUTINE W REFLEX MICROSCOPIC
Bilirubin Urine: NEGATIVE
Glucose, UA: NEGATIVE mg/dL
Hgb urine dipstick: NEGATIVE
Ketones, ur: NEGATIVE mg/dL
Leukocytes,Ua: NEGATIVE
Nitrite: NEGATIVE
Protein, ur: NEGATIVE mg/dL
Specific Gravity, Urine: 1.016 (ref 1.005–1.030)
pH: 5 (ref 5.0–8.0)

## 2023-01-05 LAB — BASIC METABOLIC PANEL
Anion gap: 11 (ref 5–15)
BUN: 12 mg/dL (ref 8–23)
CO2: 24 mmol/L (ref 22–32)
Calcium: 9.3 mg/dL (ref 8.9–10.3)
Chloride: 104 mmol/L (ref 98–111)
Creatinine, Ser: 1.13 mg/dL (ref 0.61–1.24)
GFR, Estimated: >60 mL/min (ref >60)
Glucose, Bld: 107 mg/dL — ABNORMAL HIGH (ref 70–99)
Potassium: 4.4 mmol/L (ref 3.5–5.1)
Sodium: 139 mmol/L (ref 135–145)

## 2023-01-05 LAB — RESP PANEL BY RT-PCR (RSV, FLU A&B, COVID)  RVPGX2
Influenza A by PCR: NEGATIVE
Influenza B by PCR: NEGATIVE
Resp Syncytial Virus by PCR: NEGATIVE
SARS Coronavirus 2 by RT PCR: NEGATIVE

## 2023-01-05 LAB — LIPASE, BLOOD: Lipase: 38 U/L (ref 11–51)

## 2023-01-05 LAB — BRAIN NATRIURETIC PEPTIDE: B Natriuretic Peptide: 20.3 pg/mL (ref 0.0–100.0)

## 2023-01-05 LAB — CBG MONITORING, ED: Glucose-Capillary: 95 mg/dL (ref 70–99)

## 2023-01-05 LAB — TROPONIN I (HIGH SENSITIVITY)
Troponin I (High Sensitivity): 6 ng/L (ref <18)
Troponin I (High Sensitivity): 3 ng/L (ref <18)

## 2023-01-05 LAB — MAGNESIUM: Magnesium: 2.1 mg/dL (ref 1.7–2.4)

## 2023-01-05 LAB — TSH: TSH: 0.023 u[IU]/mL — ABNORMAL LOW (ref 0.350–4.500)

## 2023-01-05 MED ORDER — ACETAMINOPHEN 650 MG RE SUPP: 650.0000 mg | Freq: Four times a day (QID) | RECTAL | Status: DC | PRN

## 2023-01-05 MED ORDER — ONDANSETRON HCL 4 MG/2ML IJ SOLN
4.0000 mg | Freq: Once | INTRAMUSCULAR | Status: AC
  Administered 2023-01-05: 4 mg via INTRAVENOUS
  Filled 2023-01-05: qty 2

## 2023-01-05 MED ORDER — ONDANSETRON HCL 4 MG/2ML IJ SOLN: 4.0000 mg | Freq: Four times a day (QID) | INTRAMUSCULAR | Status: DC | PRN

## 2023-01-05 MED ORDER — SODIUM CHLORIDE 0.9 % IV SOLN
500.0000 mg | INTRAVENOUS | Status: DC
  Administered 2023-01-06: 500 mg via INTRAVENOUS
  Filled 2023-01-05: qty 5

## 2023-01-05 MED ORDER — ENOXAPARIN SODIUM 40 MG/0.4ML IJ SOSY
40.0000 mg | PREFILLED_SYRINGE | INTRAMUSCULAR | Status: DC
  Administered 2023-01-05 – 2023-01-06 (×2): 40 mg via SUBCUTANEOUS
  Filled 2023-01-05 (×2): qty 0.4

## 2023-01-05 MED ORDER — ONDANSETRON HCL 4 MG PO TABS: 4.0000 mg | ORAL_TABLET | Freq: Four times a day (QID) | ORAL | Status: DC | PRN

## 2023-01-05 MED ORDER — MECLIZINE HCL 25 MG PO TABS
25.0000 mg | ORAL_TABLET | Freq: Once | ORAL | Status: AC
  Administered 2023-01-05: 25 mg via ORAL
  Filled 2023-01-05: qty 1

## 2023-01-05 MED ORDER — SODIUM CHLORIDE 0.9 % IV SOLN
1.0000 g | Freq: Once | INTRAVENOUS | Status: AC
  Administered 2023-01-05: 1 g via INTRAVENOUS
  Filled 2023-01-05: qty 10

## 2023-01-05 MED ORDER — LACTATED RINGERS IV BOLUS
500.0000 mL | Freq: Once | INTRAVENOUS | Status: AC
  Administered 2023-01-05: 500 mL via INTRAVENOUS

## 2023-01-05 MED ORDER — SODIUM CHLORIDE 0.9 % IV SOLN
2.0000 g | INTRAVENOUS | Status: DC
  Administered 2023-01-06: 2 g via INTRAVENOUS
  Filled 2023-01-05: qty 20

## 2023-01-05 MED ORDER — IOHEXOL 350 MG/ML SOLN
75.0000 mL | Freq: Once | INTRAVENOUS | Status: AC | PRN
  Administered 2023-01-05: 75 mL via INTRAVENOUS

## 2023-01-05 MED ORDER — SODIUM CHLORIDE 0.9 % IV SOLN
500.0000 mg | Freq: Once | INTRAVENOUS | Status: AC
  Administered 2023-01-05: 500 mg via INTRAVENOUS
  Filled 2023-01-05: qty 5

## 2023-01-05 MED ORDER — ACETAMINOPHEN 325 MG PO TABS: 650.0000 mg | ORAL_TABLET | Freq: Four times a day (QID) | ORAL | Status: DC | PRN

## 2023-01-05 NOTE — ED Triage Notes (Signed)
Patient BIB [facility] from drs office after syncopal episode where patient's eyes rolled in the back of his head and he passed out per office. EMS reports diaphoretic upon their arrival, fecal incontinence not per baseline, 2L o2 also not per baseline and vomiting. 22g L hand, HR 56, 97% 2L

## 2023-01-05 NOTE — ED Provider Notes (Signed)
[facility] Provider Note   CSN: [id number] Arrival date & time: 01/05/23  1110     History  Chief Complaint  Patient presents with   Loss of Consciousness    Eric Tyler is a 77 y.o. male.  HPI Patient presents for syncope.  Medical history includes HLD, memory loss, prediabetes.  He has a recent cough over the past few days.  He has otherwise been in his normal state of health.  He denies any history of syncope or seizures.  Today, he was accompanying his wife at a routine doctor visit.  While at the [facility] office, he had a witnessed syncopal episode.  This was witnessed by his wife.  He was seated in a chair at the time and did not suffer a fall.  Wife states that his eyes were back in his head and he was unresponsive.  Estimated duration is 2 to 3 minutes.  When he regained consciousness, he had several episodes of vomiting.  He did not have any confusion.  EMS was called doctor's office.  EMS noted heart rate in the range of 50s.  He is not hypoxic was placed on supplemental oxygen for comfort.  He did have continued nausea.  Small IV fluids were given prior to arrival.  Patient denies any prodromal symptoms.  He denies any associated chest pains since the episode.  He does endorse ongoing nausea, fatigue, and generalized weakness.    Home Medications Prior to Admission medications   Medication Sig Start Date End Date Taking? Authorizing Provider  Acetaminophen (TYLENOL 8 HOUR ARTHRITIS PAIN PO) Take 1 tablet by mouth 3 (three) times daily as needed. Patient not taking: No sig reported 09/24/17   [provider]  aspirin EC 81 MG tablet Take 81 mg by mouth daily.    [provider]  cetirizine (ZYRTEC) 10 MG tablet Take 1 tablet (10 mg total) by mouth daily for 10 days. 11/05/17 05/02/20  Horald Pollen, MD  clobetasol ointment (TEMOVATE) 0.05 % SMARTSIG:Sparingly Topical Twice Daily 07/07/21   [provider]  donepezil (ARICEPT) 10 MG tablet TAKE 1 TABLET BY MOUTH EVERYDAY AT BEDTIME 10/16/20   Suzzanne Cloud, NP  Ferrous Sulfate (IRON) 325 (65 Fe) MG TABS 1 tablet    [provider]  furosemide (LASIX) 20 MG tablet Take 20 mg by mouth daily. 05/12/21   [provider]  ibuprofen (ADVIL) 600 MG tablet Take 1 tablet (600 mg total) by mouth every 6 (six) hours as needed. 07/28/21   Pearson Forster, NP  lidocaine (XYLOCAINE) 5 % ointment Apply 1 application topically as needed. 07/28/21   Pearson Forster, NP  metoprolol succinate (TOPROL-XL) 25 MG 24 hr tablet Take 1 tablet by mouth daily.    [provider]  Multiple Vitamin (MULTIVITAMIN WITH MINERALS) TABS tablet Take 1 tablet by mouth daily.    [provider]  omega-3 acid ethyl esters (LOVAZA) 1 G capsule Take 1 g by mouth 2 (two) times daily. Patient not taking: No sig reported    [provider]  ondansetron (ZOFRAN-ODT) 8 MG disintegrating tablet Take 1 tablet (8 mg total) by mouth every 8 (eight) hours as needed for nausea. 07/18/22   Nanavati, Ankit, MD  Risankizumab-rzaa,150 MG Dose, (SKYRIZI, 150 MG DOSE,) 75 MG/0.83ML PSKT Inject into the skin. Prescribed by a different provider    [provider]      Allergies    Patient  has no known allergies.    Review of Systems   Review of Systems  Constitutional:  Positive for diaphoresis.  Respiratory:  Positive for cough.   Gastrointestinal:  Positive for nausea and vomiting.  Neurological:  Positive for syncope and weakness (generalized).  All other systems reviewed and are negative.   Physical Exam Updated Vital Signs BP (!) 121/45   Pulse 69   Temp 98.7 F (37.1 C) (Axillary)   Resp 20   Ht '5\' 11"'$  (1.803 m)   Wt 85.7 kg   SpO2 100%   BMI 26.36 kg/m  Physical Exam Vitals and nursing note reviewed.  Constitutional:      General: He is not in acute distress.    Appearance: He is well-developed and normal weight.  He is not toxic-appearing or diaphoretic.  HENT:     Head: Normocephalic and atraumatic.     Right Ear: External ear normal.     Left Ear: External ear normal.     Nose: Nose normal.     Mouth/Throat:     Mouth: Mucous membranes are moist.  Eyes:     Extraocular Movements: Extraocular movements intact.     Conjunctiva/sclera: Conjunctivae normal.  Cardiovascular:     Rate and Rhythm: Regular rhythm. Bradycardia present.     Heart sounds: No murmur heard. Pulmonary:     Effort: Pulmonary effort is normal. No respiratory distress.     Breath sounds: Examination of the right-lower field reveals decreased breath sounds. Examination of the left-lower field reveals decreased breath sounds. Decreased breath sounds present. No wheezing or rales.  Abdominal:     General: There is no distension.     Palpations: Abdomen is soft.     Tenderness: There is no abdominal tenderness.  Musculoskeletal:        General: No swelling. Normal range of motion.     Cervical back: Normal range of motion and neck supple.     Right lower leg: No edema.     Left lower leg: No edema.  Skin:    General: Skin is warm and dry.     Capillary Refill: Capillary refill takes less than 2 seconds.     Coloration: Skin is not jaundiced or pale.  Neurological:     General: No focal deficit present.     Mental Status: He is alert and oriented to person, place, and time.     Cranial Nerves: No cranial nerve deficit.     Sensory: No sensory deficit.     Motor: No weakness.     Coordination: Coordination normal.  Psychiatric:        Mood and Affect: Mood normal.        Behavior: Behavior normal.        Thought Content: Thought content normal.        Judgment: Judgment normal.     ED Results / Procedures / Treatments   Labs (all labs ordered are listed, but only abnormal results are displayed) Labs Reviewed  BASIC METABOLIC PANEL - Abnormal; Notable for the following components:      Result Value   Glucose, Bld  107 (*)    All other components within normal limits  CBC WITH DIFFERENTIAL/PLATELET - Abnormal; Notable for the following components:   RBC 3.83 (*)    Hemoglobin 12.4 (*)    HCT 37.8 (*)    All other components within normal limits  HEPATIC FUNCTION PANEL - Abnormal; Notable for the following components:   Total  Bilirubin 0.1 (*)    Indirect Bilirubin 0.0 (*)    All other components within normal limits  RESP PANEL BY RT-PCR (RSV, FLU A&B, COVID)  RVPGX2  LIPASE, BLOOD  URINALYSIS, ROUTINE W REFLEX MICROSCOPIC  BRAIN NATRIURETIC PEPTIDE  MAGNESIUM  TSH  CBG MONITORING, ED  TROPONIN I (HIGH SENSITIVITY)  TROPONIN I (HIGH SENSITIVITY)    EKG EKG Interpretation  Date/Time:  Tuesday January 05 2023 11:44:50 EST Ventricular Rate:  55 PR Interval:  215 QRS Duration: 95 QT Interval:  437 QTC Calculation: 418 R Axis:   52 Text Interpretation: Sinus rhythm Borderline prolonged PR interval Low voltage, precordial leads Borderline T abnormalities, anterior leads Confirmed by Godfrey Pick [phone]) on 01/05/2023 12:46:17 PM  Radiology CT HEAD WO CONTRAST  Result Date: 01/05/2023 CLINICAL DATA:  Syncope EXAM: CT HEAD WITHOUT CONTRAST TECHNIQUE: Contiguous axial images were obtained from the base of the skull through the vertex without intravenous contrast. RADIATION DOSE REDUCTION: This exam was performed according to the departmental dose-optimization program which includes automated exposure control, adjustment of the mA and/or kV according to patient size and/or use of iterative reconstruction technique. COMPARISON:  None Available. FINDINGS: Brain: No evidence of acute infarction, hemorrhage, hydrocephalus, extra-axial collection or mass lesion/mass effect. There is sequela of severe chronic microvascular ischemic change. Vascular: No hyperdense vessel or unexpected calcification. Skull: Normal. Negative for fracture or focal lesion. Sinuses/Orbits: No middle ear or mastoid effusion. Paranasal  sinuses are clear. Bilateral lens replacement. Orbits are otherwise unremarkable. Other: None. IMPRESSION: No acute intracranial abnormality. Electronically Signed   By: Marin Roberts M.D.   On: 01/05/2023 13:16   DG Chest Port 1 View  Result Date: 01/05/2023 CLINICAL DATA:  Cough. EXAM: PORTABLE CHEST 1 VIEW COMPARISON:  None Available. FINDINGS: Cardiac silhouette and mediastinal contours are within limits. Mild-to-moderate calcification within the aortic arch. Mild bilateral lower lung bronchovascular crowding and possible interstitial thickening. No focal airspace opacity. No pleural effusion or pneumothorax. Mild levocurvature of the upper thoracic spine. Mild-to-moderate multilevel degenerative disc changes. IMPRESSION: Mild bilateral lower lung bronchovascular crowding and possible interstitial thickening. No comparison is available. This may be secondary to subsegmental atelectasis versus mild chronic scarring. Minimal interstitial pulmonary edema is felt less likely. Electronically Signed   By: Yvonne Kendall M.D.   On: 01/05/2023 12:25    Procedures Procedures    Medications Ordered in ED Medications  lactated ringers bolus 500 mL (0 mLs Intravenous Stopped 01/05/23 1503)  meclizine (ANTIVERT) tablet 25 mg (25 mg Oral Given 01/05/23 1542)  ondansetron (ZOFRAN) injection 4 mg (4 mg Intravenous Given 01/05/23 1542)  lactated ringers bolus 500 mL (0 mLs Intravenous Stopped 01/05/23 1634)    ED Course/ Medical Decision Making/ A&P                             Medical Decision Making Amount and/or Complexity of Data Reviewed Labs: ordered. Radiology: ordered. ECG/medicine tests: ordered.  Risk Prescription drug management.   This patient presents to the ED for concern of syncope, this involves an extensive number of treatment options, and is a complaint that carries with it a high risk of complications and morbidity.  The differential diagnosis includes vasovagal episode, arrhythmia, CVA,  TIA, seizure hypoglycemia, other metabolic derangements   Co morbidities that complicate the patient evaluation  HLD, memory loss, prediabetes   Additional history obtained:  Additional history obtained from EMS, patient's wife External records from outside source obtained  and reviewed including EMR   Lab Tests:  I Ordered, and personally interpreted labs.  The pertinent results include: Hemoglobin is slightly decreased from last year.  No leukocytosis is present.  Kidney function and electrolytes are normal.  Troponins are normal x 2.   Imaging Studies ordered:  I ordered imaging studies including CT head, chest x-ray, MRI brain I independently visualized and interpreted imaging which showed mild bibasilar bronchovascular crowding and interstitial thickening on chest x-ray.  No acute findings on CT head.  MRI pending at time of signout. I agree with the radiologist interpretation   Cardiac Monitoring: / EKG:  The patient was maintained on a cardiac monitor.  I personally viewed and interpreted the cardiac monitored which showed an underlying rhythm of: Sinus rhythm   Problem List / ED Course / Critical interventions / Medication management  Patient is a 77 year old male who presents after a witnessed syncopal episode.  This occurred shortly prior to arrival while he was at his wife's doctor's appointment.  He did not sustain a fall.  He does not recall a prodrome.  Wife reports that his eyes rolled back and he was unresponsive for several minutes.  When he came to, he had an nausea and vomiting.  He had quick return to mental baseline.  With EMS, he was coughing a lot and they were concerned of aspiration.  Although he was never hypoxic, they did place him on 2 L of supplemental oxygen.  On arrival, patient endorses fatigue.  He denies current shortness of breath.  He has no focal neurologic deficits on exam.  No cardiac rubs or murmurs are appreciated.  Lungs are clear to  auscultation.  She was placed on bedside cardiac monitor.  Diagnostic workup was initiated.  Initial workup results are reassuring.  There is no clear evidence of focal opacity on chest x-ray.  CT of head was unremarkable.  Lab work is reassuring.  On bedside cardiac monitor, patient remained in normal sinus rhythm with heart rate in the range of 50-60.  Per chart review, this does appear to be his baseline.  On reassessment, patient resting in left lateral decubitus position.  He does endorse current chills.  His son is at bedside and states that his father is usually more talkative.  When attempting to stand up, patient became severely dizzy and nauseous.  Due to concern of posterior stroke.  Patient to undergo MRI.  Additional IV fluids, Zofran, and meclizine were ordered for symptomatic relief.  Care patient was signed out to oncoming ED provider. I ordered medication including IV fluids for duration; Zofran for nausea; meclizine for dizziness Reevaluation of the patient after these medicines showed that the patient improved I have reviewed the patients home medicines and have made adjustments as needed   Social Determinants of Health:  Lives at home with wife        Final Clinical Impression(s) / ED Diagnoses Final diagnoses:  Syncope, unspecified syncope type  Nausea and vomiting, unspecified vomiting type  Dizziness    Rx / DC Orders ED Discharge Orders     None         Godfrey Pick, MD 01/05/23 1710

## 2023-01-06 ENCOUNTER — Observation Stay (HOSPITAL_COMMUNITY): Payer: Medicare HMO

## 2023-01-06 ENCOUNTER — Other Ambulatory Visit: Payer: Self-pay

## 2023-01-06 DIAGNOSIS — G3184 Mild cognitive impairment, so stated: Secondary | ICD-10-CM | POA: Diagnosis present

## 2023-01-06 DIAGNOSIS — Z83438 Family history of other disorder of lipoprotein metabolism and other lipidemia: Secondary | ICD-10-CM | POA: Diagnosis not present

## 2023-01-06 DIAGNOSIS — Z8249 Family history of ischemic heart disease and other diseases of the circulatory system: Secondary | ICD-10-CM | POA: Diagnosis not present

## 2023-01-06 DIAGNOSIS — Z79899 Other long term (current) drug therapy: Secondary | ICD-10-CM | POA: Diagnosis not present

## 2023-01-06 DIAGNOSIS — R0902 Hypoxemia: Secondary | ICD-10-CM | POA: Diagnosis present

## 2023-01-06 DIAGNOSIS — R7303 Prediabetes: Secondary | ICD-10-CM | POA: Diagnosis present

## 2023-01-06 DIAGNOSIS — R55 Syncope and collapse: Secondary | ICD-10-CM | POA: Diagnosis present

## 2023-01-06 DIAGNOSIS — T451X5A Adverse effect of antineoplastic and immunosuppressive drugs, initial encounter: Secondary | ICD-10-CM | POA: Diagnosis present

## 2023-01-06 DIAGNOSIS — Z1152 Encounter for screening for COVID-19: Secondary | ICD-10-CM | POA: Diagnosis not present

## 2023-01-06 DIAGNOSIS — E785 Hyperlipidemia, unspecified: Secondary | ICD-10-CM | POA: Diagnosis present

## 2023-01-06 DIAGNOSIS — Z809 Family history of malignant neoplasm, unspecified: Secondary | ICD-10-CM | POA: Diagnosis not present

## 2023-01-06 DIAGNOSIS — Z7982 Long term (current) use of aspirin: Secondary | ICD-10-CM | POA: Diagnosis not present

## 2023-01-06 DIAGNOSIS — E032 Hypothyroidism due to medicaments and other exogenous substances: Secondary | ICD-10-CM | POA: Diagnosis present

## 2023-01-06 DIAGNOSIS — R7989 Other specified abnormal findings of blood chemistry: Secondary | ICD-10-CM | POA: Diagnosis not present

## 2023-01-06 DIAGNOSIS — C8400 Mycosis fungoides, unspecified site: Secondary | ICD-10-CM | POA: Diagnosis present

## 2023-01-06 DIAGNOSIS — J189 Pneumonia, unspecified organism: Secondary | ICD-10-CM

## 2023-01-06 LAB — COMPREHENSIVE METABOLIC PANEL
ALT: 12 U/L (ref 0–44)
AST: 16 U/L (ref 15–41)
Albumin: 2.8 g/dL — ABNORMAL LOW (ref 3.5–5.0)
Alkaline Phosphatase: 35 U/L — ABNORMAL LOW (ref 38–126)
Anion gap: 10 (ref 5–15)
BUN: 13 mg/dL (ref 8–23)
CO2: 24 mmol/L (ref 22–32)
Calcium: 8.5 mg/dL — ABNORMAL LOW (ref 8.9–10.3)
Chloride: 105 mmol/L (ref 98–111)
Creatinine, Ser: 1.17 mg/dL (ref 0.61–1.24)
GFR, Estimated: >60 mL/min (ref >60)
Glucose, Bld: 119 mg/dL — ABNORMAL HIGH (ref 70–99)
Potassium: 4.1 mmol/L (ref 3.5–5.1)
Sodium: 139 mmol/L (ref 135–145)
Total Bilirubin: 0.5 mg/dL (ref 0.3–1.2)
Total Protein: 5.2 g/dL — ABNORMAL LOW (ref 6.5–8.1)

## 2023-01-06 LAB — CBC
HCT: 32.7 % — ABNORMAL LOW (ref 39.0–52.0)
Hemoglobin: 10.4 g/dL — ABNORMAL LOW (ref 13.0–17.0)
MCH: 31.6 pg (ref 26.0–34.0)
MCHC: 31.8 g/dL (ref 30.0–36.0)
MCV: 99.4 fL (ref 80.0–100.0)
Platelets: 229 10*3/uL (ref 150–400)
RBC: 3.29 MIL/uL — ABNORMAL LOW (ref 4.22–5.81)
RDW: 13.9 % (ref 11.5–15.5)
WBC: 16.7 10*3/uL — ABNORMAL HIGH (ref 4.0–10.5)
nRBC: 0 % (ref 0.0–0.2)

## 2023-01-06 LAB — ECHOCARDIOGRAM COMPLETE
Area-P 1/2: 3.99 cm2
Calc EF: 52.8 %
Height: 71 in
S' Lateral: 3.2 cm
Single Plane A2C EF: 55.2 %
Single Plane A4C EF: 50.6 %
Weight: 3024 oz

## 2023-01-06 LAB — T4, FREE: Free T4: 0.77 ng/dL (ref 0.61–1.12)

## 2023-01-06 LAB — LACTIC ACID, PLASMA
Lactic Acid, Venous: 2.5 mmol/L (ref 0.5–1.9)
Lactic Acid, Venous: 1.8 mmol/L (ref 0.5–1.9)

## 2023-01-06 MED ORDER — LACTATED RINGERS IV BOLUS
1000.0000 mL | Freq: Once | INTRAVENOUS | Status: AC
  Administered 2023-01-06: 1000 mL via INTRAVENOUS

## 2023-01-06 MED ORDER — HYDROXYZINE HCL 10 MG PO TABS: 10.0000 mg | ORAL_TABLET | Freq: Every evening | ORAL | Status: DC | PRN

## 2023-01-06 MED ORDER — ROSUVASTATIN CALCIUM 5 MG PO TABS
10.0000 mg | ORAL_TABLET | Freq: Every day | ORAL | Status: DC
  Administered 2023-01-06: 10 mg via ORAL
  Filled 2023-01-06: qty 2

## 2023-01-06 MED ORDER — LEVOTHYROXINE SODIUM 50 MCG PO TABS
50.0000 ug | ORAL_TABLET | Freq: Every day | ORAL | Status: DC
  Administered 2023-01-06 – 2023-01-07 (×2): 50 ug via ORAL
  Filled 2023-01-06: qty 2
  Filled 2023-01-06: qty 1

## 2023-01-06 MED ORDER — FOLIC ACID 1 MG PO TABS
1.0000 mg | ORAL_TABLET | Freq: Every day | ORAL | Status: DC
  Administered 2023-01-06 – 2023-01-07 (×2): 1 mg via ORAL
  Filled 2023-01-06 (×2): qty 1

## 2023-01-06 MED ORDER — ADULT MULTIVITAMIN W/MINERALS CH
1.0000 | ORAL_TABLET | Freq: Every day | ORAL | Status: DC
  Administered 2023-01-06 – 2023-01-07 (×2): 1 via ORAL
  Filled 2023-01-06 (×2): qty 1

## 2023-01-06 MED ORDER — ASPIRIN 81 MG PO TBEC
81.0000 mg | DELAYED_RELEASE_TABLET | Freq: Every day | ORAL | Status: DC
  Administered 2023-01-06 – 2023-01-07 (×2): 81 mg via ORAL
  Filled 2023-01-06 (×2): qty 1

## 2023-01-06 MED ORDER — FENOFIBRATE 54 MG PO TABS
54.0000 mg | ORAL_TABLET | Freq: Every day | ORAL | Status: DC
  Administered 2023-01-06 – 2023-01-07 (×2): 54 mg via ORAL
  Filled 2023-01-06 (×2): qty 1

## 2023-01-06 MED ORDER — OMEGA-3-ACID ETHYL ESTERS 1 G PO CAPS
1.0000 g | ORAL_CAPSULE | Freq: Two times a day (BID) | ORAL | Status: DC
  Administered 2023-01-06 – 2023-01-07 (×3): 1 g via ORAL
  Filled 2023-01-06 (×3): qty 1

## 2023-01-06 MED ORDER — CLOBETASOL PROPIONATE 0.05 % EX OINT
1.0000 | TOPICAL_OINTMENT | Freq: Two times a day (BID) | CUTANEOUS | Status: DC
  Administered 2023-01-06 – 2023-01-07 (×3): 1 via TOPICAL
  Filled 2023-01-06 (×2): qty 15

## 2023-01-06 MED ORDER — DONEPEZIL HCL 10 MG PO TABS
10.0000 mg | ORAL_TABLET | Freq: Every day | ORAL | Status: DC
  Administered 2023-01-06 (×2): 10 mg via ORAL
  Filled 2023-01-06 (×2): qty 1

## 2023-01-06 NOTE — ED Notes (Signed)
Admitting MD at bedside. Patient being titrated down on oxygen requirement at this time.

## 2023-01-06 NOTE — Assessment & Plan Note (Addendum)
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 Cont home synthroid when med-rec completed Check FT4

## 2023-01-06 NOTE — Assessment & Plan Note (Addendum)
Hold Tagretin pending rule out of sepsis pathology.

## 2023-01-06 NOTE — Plan of Care (Signed)
  Problem: Activity: Goal: Ability to tolerate increased activity will improve Outcome: Progressing   Problem: Clinical Measurements: Goal: Ability to maintain a body temperature in the normal range will improve Outcome: Progressing   Problem: Respiratory: Goal: Ability to maintain adequate ventilation will improve Outcome: Progressing Goal: Ability to maintain a clear airway will improve Outcome: Progressing   Problem: Education: Goal: Knowledge of condition and prescribed therapy will improve Outcome: Progressing   Problem: Cardiac: Goal: Will achieve and/or maintain adequate cardiac output Outcome: Progressing   Problem: Physical Regulation: Goal: Complications related to the disease process, condition or treatment will be avoided or minimized Outcome: Progressing

## 2023-01-06 NOTE — Assessment & Plan Note (Signed)
Related to hypotension / PNA? Arrythmia? Tele monitor 2d echo Treating infection as above

## 2023-01-06 NOTE — H&P (Signed)
History and Physical    Patient: Eric Tyler LKG:[id number] DOB: 1946-10-23 DOA: 01/05/2023 DOS: the patient was seen and examined on 01/06/2023 PCP: [name], MD  Patient coming from: Home  Chief Complaint:  Chief Complaint  Patient presents with   Loss of Consciousness   HPI: Eric Tyler is a 77 y.o. male with medical history significant of HLD, MCI, mycosis fungoides on Targretin.  Pt with cough over past few days, otherwise in normal state of health.  Today was accompanying wife on routine doctors office visit (for wife, not for him).  While at the [place] office, he had a witnessed syncopal episode. This was witnessed by his wife. He was seated in a chair at the time and did not suffer a fall. Wife states that his eyes were back in his head and he was unresponsive. Estimated duration is 2 to 3 minutes. When he regained consciousness, he had several episodes of vomiting. He did not have any confusion. EMS was called doctor's office. EMS noted heart rate in the range of 50s. He is not hypoxic was placed on supplemental oxygen for comfort. He did have continued nausea.  No abd pain, no SOB.  No new skin lesions or changes to skin lesions from Mycosis fungoides.  No concerns about skin lesions at this time.  No other complaints.   Review of Systems: As mentioned in the history of present illness. All other systems reviewed and are negative. Past Medical History:  Diagnosis Date   Allergy    Hyperlipidemia    Memory loss    Prediabetes    Past Surgical History:  Procedure Laterality Date   FINGER SURGERY     Right thumb   NM MYOVIEW LTD  08/2018   Myoview: EF 48% with global HK.  Mild reversibility involving the mid anteroseptal wall suspicious for ischemia.  Intermediate risk.   Social History:  reports that he has never smoked. He has never used smokeless tobacco. He reports that he does not drink alcohol and does not use drugs.  No Known Allergies  Family  History  Problem Relation Age of Onset   Cancer Mother    Hypertension Mother    Hypertension Father    Hyperlipidemia Father    Hypertension Son     Prior to Admission medications   Medication Sig Start Date End Date Taking? Authorizing Provider  Acetaminophen (TYLENOL 8 HOUR ARTHRITIS PAIN PO) Take 1 tablet by mouth 3 (three) times daily as needed (for pain). 09/24/17  Yes [provider]  aspirin EC 81 MG tablet Take 81 mg by mouth daily.   Yes [provider]  bexarotene (TARGRETIN) 75 MG CAPS capsule Take 150 mg by mouth daily.   Yes [provider]  cetirizine (ZYRTEC) 10 MG tablet Take 1 tablet (10 mg total) by mouth daily for 10 days. Patient taking differently: Take 10 mg by mouth daily as needed for allergies. 11/05/17 01/07/24 Yes Sagardia, Ines Bloomer, MD  Cholecalciferol (VITAMIN D3 PO) Take 1 capsule by mouth daily.   Yes [provider]  clobetasol ointment (TEMOVATE) 6.64 % Apply 1 Application topically 2 (two) times daily. for skin cancer to keep skin moisturized 07/07/21  Yes [provider]  donepezil (ARICEPT) 10 MG tablet TAKE 1 TABLET BY MOUTH EVERYDAY AT BEDTIME Patient taking differently: Take 10 mg by mouth at bedtime. 10/16/20  Yes Suzzanne Cloud, NP  Ferrous Sulfate (IRON PO) Take 1 tablet by mouth daily.   Yes  [provider]  folic acid (FOLVITE) 1 MG tablet Take 1 tablet by mouth daily. 11/10/22 11/10/23 Yes [provider]  furosemide (LASIX) 20 MG tablet Take 20 mg by mouth daily. 05/12/21  Yes [provider]  hydrOXYzine (ATARAX) 10 MG tablet Take 10 mg by mouth at bedtime as needed for itching. 12/28/22  Yes [provider]  levothyroxine (SYNTHROID) 50 MCG tablet Take 50 mcg by mouth daily before breakfast. 10/29/22  Yes [provider]  metoprolol succinate (TOPROL-XL) 25 MG 24 hr tablet Take 25 mg by mouth daily.   Yes [provider]  Multiple Vitamin  (MULTIVITAMIN WITH MINERALS) TABS tablet Take 1 tablet by mouth daily.   Yes [provider]  omega-3 acid ethyl esters (LOVAZA) 1 G capsule Take 1 g by mouth 2 (two) times daily.   Yes [provider]  ondansetron (ZOFRAN-ODT) 8 MG disintegrating tablet Take 1 tablet (8 mg total) by mouth every 8 (eight) hours as needed for nausea. 07/18/22  Yes Nanavati, Ankit, MD  rosuvastatin (CRESTOR) 10 MG tablet Take 10 mg by mouth at bedtime. 12/10/22  Yes [provider]  fenofibrate (TRICOR) 48 MG tablet Take 48 mg by mouth daily.    [provider]  triamcinolone cream (KENALOG) 0.1 % Apply 1 Application topically 2 (two) times daily.    [provider]    Physical Exam: Vitals:   01/05/23 2115 01/05/23 2145 01/05/23 2200 01/05/23 2224  BP: 106/61 117/69 129/66 (!) 105/59  Pulse: 88   81  Resp: (!) 29 20 (!) 22 (!) 29  Temp:      TempSrc:      SpO2: 92%   95%  Weight:      Height:       Constitutional: NAD, calm, comfortable Respiratory: Mild tachypnea with RR of 30, Normal respiratory effort. No accessory muscle use.  Cardiovascular: Regular rate and rhythm, no murmurs / rubs / gallops. No extremity edema. 2+ pedal pulses. No carotid bruits.  Abdomen: no tenderness, no masses palpated. No hepatosplenomegaly. Bowel sounds positive.  Neurologic: Grossly Non-focal Psychiatric: Normal judgment and insight. Alert and oriented x 3. Normal mood.   Data Reviewed:        Latest Ref Rng & Units 01/05/2023   11:54 AM 07/18/2022    9:15 AM 03/25/2018   10:12 AM  CBC  WBC 4.0 - 10.5 K/uL 5.1  3.2  3.7   Hemoglobin 13.0 - 17.0 g/dL 12.4  13.4  13.2   Hematocrit 39.0 - 52.0 % 37.8  40.9  40.2   Platelets 150 - 400 K/uL 259  290  269       Latest Ref Rng & Units 01/05/2023   11:54 AM 07/18/2022    9:15 AM 10/18/2015    6:35 AM  CMP  Glucose 70 - 99 mg/dL 107  133  123   BUN 8 - 23 mg/dL '12  14  14   '$ Creatinine 0.61 - 1.24 mg/dL 1.13  1.00  1.19    Sodium 135 - 145 mmol/L 139  137  141   Potassium 3.5 - 5.1 mmol/L 4.4  4.5  4.2   Chloride 98 - 111 mmol/L 104  101  105   CO2 22 - 32 mmol/L '24  28  27   '$ Calcium 8.9 - 10.3 mg/dL 9.3  9.7  9.6   Total Protein 6.5 - 8.1 g/dL 6.5  7.3  7.5   Total Bilirubin 0.3 - 1.2 mg/dL  0.1  0.5  0.9   Alkaline Phos 38 - 126 U/L 45  45  55   AST 15 - 41 U/L '24  15  30   '$ ALT 0 - 44 U/L '14  10  28    '$ CTA chest: IMPRESSION: 1. No pulmonary embolus. 2. Bilateral consolidative airspace disease, most prominent in the lower lobes, consistent with pneumonia. Additional patchy consolidation in the perihilar right upper lobe and tree in bud opacities in the left upper lobe consistent, likely infectious.  Assessment and Plan: * CAP (community acquired pneumonia) PNA with new O2 requirement. Suspect PNA may have been ongoing prior to syncopal event today as he did have a new cough for past 2 days.  Alternatively could represent aspiration PNA from the syncope and subsequent vomiting. BP on soft side in ED, early sepsis pathology is in the differential here despite nl WBC (nl WBC could be due to chemo agent he is taking for mycosis fungoides) has tachypnea but no other SIRS at this time.  Not toxic appearing, denies SOB. Ordering 1L bolus Check lactate BCx Tele monitor PNA pathway for CAP Rocephin / azithro COVID, FLU, RSV pending O2 via [place] Cont pulse ox Clear liquid diet only given the N/V earlier Zofran PRN  Hypoxia New O2 requirement in setting of PNA.  Syncope Related to hypotension / PNA? Arrythmia? Tele monitor 2d echo Treating infection as above  Mycosis fungoides ([facility]) Hold Tagretin pending rule out of sepsis pathology.  Low TSH level Likely HYPOthyroid with this: Known side effect of the chemo agent (targretin) he is taking for Mycosis fungoides (causes HYPOthyroidism by directly inhibiting TSH secretion). Cont home synthroid when med-rec completed Check FT4      Advance Care  Planning:   Code Status: Full Code  Consults: None  Family Communication: Wife at bedside  Severity of Illness: The appropriate patient status for this patient is OBSERVATION. Observation status is judged to be reasonable and necessary in order to provide the required intensity of service to ensure the patient's safety. The patient's presenting symptoms, physical exam findings, and initial radiographic and laboratory data in the context of their medical condition is felt to place them at decreased risk for further clinical deterioration. Furthermore, it is anticipated that the patient will be medically stable for discharge from the hospital within 2 midnights of admission.   Author: Etta Quill., DO 01/06/2023 12:26 AM  For on call review [email or web address].

## 2023-01-06 NOTE — Assessment & Plan Note (Signed)
New O2 requirement in setting of PNA.

## 2023-01-06 NOTE — Progress Notes (Signed)
    Patient: Eric Tyler ZXY:[id number] DOB: 1945/12/22      Brief hospital course: 77 y.o. male with medical history significant of HLD, MCI, mycosis fungoides on Targretin.   Pt with cough over past few days, otherwise in normal state of health.  Today was accompanying wife on routine doctors office visit (for wife, not for him).  While at the [place] office, he had a witnessed syncopal episode. This was witnessed by his wife. He was seated in a chair at the time and did not suffer a fall. Wife states that his eyes were back in his head and he was unresponsive     This is a no charge note, for further details, please see the H&P by my partner, Dr. Alcario Drought from earlier today.   Principal Problem:   CAP (community acquired pneumonia) Active Problems:   Syncope   Hypoxia   Mycosis fungoides ([facility])   Mild cognitive impairment   Low TSH level   Near syncope    Community acquired pneumonia causing syncope  - Continue antibiotics and pulmonary toilet, still on o2       Physical Exam: BP 114/63 (BP Location: Left Arm)   Pulse 62   Temp 98.2 F (36.8 C)   Resp 17   Ht '6\' 1"'$  (1.854 m)   Wt 85.7 kg   SpO2 100%   BMI 24.93 kg/m   Patient seen and examined.      Family Communication: Wife, son and daughter at bedside        Author: Edwin Dada, MD 01/06/2023 6:48 PM

## 2023-01-06 NOTE — ED Notes (Signed)
Flutter valve and Incentive Spirometry at bedside. Education provided to patient and wife. Patient and wife deny any questions at this time.

## 2023-01-06 NOTE — ED Notes (Signed)
ED TO INPATIENT HANDOFF REPORT  ED Nurse Name and Phone #: Richardson Landry [phone]  S Name/Age/Gender Eric Tyler 77 y.o. male Room/Bed: 043C/043C  Code Status   Code Status: Full Code  Home/SNF/Other Home Patient oriented to: self, place, time, and situation Is this baseline? Yes   Triage Complete: Triage complete  Chief Complaint Syncope [R55] Near syncope [R55]  Triage Note Patient BIB [facility] from drs office after syncopal episode where patient's eyes rolled in the back of his head and he passed out per office. EMS reports diaphoretic upon their arrival, fecal incontinence not per baseline, 2L o2 also not per baseline and vomiting. 22g L hand, HR 56, 97% 2L   Allergies No Known Allergies  Level of Care/Admitting Diagnosis ED Disposition     ED Disposition  Admit   Condition  --   Comment  Hospital Area: [facility] [[id number]]  Level of Care: Telemetry Medical [104]  May place patient in observation at [facility] or [facility] if equivalent level of care is available:: Yes  Covid Evaluation: Confirmed COVID Negative  Diagnosis: Near syncope [692301]  Admitting Physician: Edwin Dada [[id number]]  Attending Physician: Edwin Dada [[id number]]          B Medical/Surgery History Past Medical History:  Diagnosis Date   Allergy    Hyperlipidemia    Memory loss    Prediabetes    Past Surgical History:  Procedure Laterality Date   FINGER SURGERY     Right thumb   NM MYOVIEW LTD  08/2018   Myoview: EF 48% with global HK.  Mild reversibility involving the mid anteroseptal wall suspicious for ischemia.  Intermediate risk.     A IV Location/Drains/Wounds Patient Lines/Drains/Airways Status     Active Line/Drains/Airways     Name Placement date Placement time Site Days   Peripheral IV 07/18/22 22 G Right Antecubital 07/18/22  0930  Antecubital  172   Peripheral IV 01/05/23 22 G Left;Posterior Hand 01/05/23  1123  Hand  1    Peripheral IV 01/05/23 20 G Left Antecubital 01/05/23  1148  Antecubital  1            Intake/Output Last 24 hours  Intake/Output Summary (Last 24 hours) at 01/06/2023 1317 Last data filed at 01/06/2023 0208 Gross per 24 hour  Intake 1100 ml  Output --  Net 1100 ml    Labs/Imaging Results for orders placed or performed during the hospital encounter of 01/05/23 (from the past 48 hour(s))  Resp panel by RT-PCR (RSV, Flu A&B, Covid) Anterior Nasal Swab     Status: None   Collection Time: 01/05/23 11:39 AM   Specimen: Anterior Nasal Swab  Result Value Ref Range   SARS Coronavirus 2 by RT PCR NEGATIVE NEGATIVE   Influenza A by PCR NEGATIVE NEGATIVE   Influenza B by PCR NEGATIVE NEGATIVE    Comment: (NOTE) The Xpert Xpress SARS-CoV-2/FLU/RSV plus assay is intended as an aid in the diagnosis of influenza from Nasopharyngeal swab specimens and should not be used as a sole basis for treatment. Nasal washings and aspirates are unacceptable for Xpert Xpress SARS-CoV-2/FLU/RSV testing.  Fact Sheet for Patients: EntrepreneurPulse.com.au  Fact Sheet for Healthcare Providers: IncredibleEmployment.be  This test is not yet approved or cleared by the [place] FDA and has been authorized for detection and/or diagnosis of SARS-CoV-2 by FDA under an Emergency Use Authorization (EUA). This EUA will remain in effect (meaning this test can be used) for the duration  of the COVID-19 declaration under Section 564(b)(1) of the Act, 21 U.S.C. section 360bbb-3(b)(1), unless the authorization is terminated or revoked.     Resp Syncytial Virus by PCR NEGATIVE NEGATIVE    Comment: (NOTE) Fact Sheet for Patients: EntrepreneurPulse.com.au  Fact Sheet for Healthcare Providers: IncredibleEmployment.be  This test is not yet approved or cleared by the [place] FDA and has been authorized for detection and/or diagnosis of  SARS-CoV-2 by FDA under an Emergency Use Authorization (EUA). This EUA will remain in effect (meaning this test can be used) for the duration of the COVID-19 declaration under Section 564(b)(1) of the Act, 21 U.S.C. section 360bbb-3(b)(1), unless the authorization is terminated or revoked.  Performed at [facility], [place] [address], [place], [place] [address]   CBG monitoring, ED     Status: None   Collection Time: 01/05/23 11:43 AM  Result Value Ref Range   Glucose-Capillary 95 70 - 99 mg/dL    Comment: Glucose reference range applies only to samples taken after fasting for at least 8 hours.  Basic metabolic panel     Status: Abnormal   Collection Time: 01/05/23 11:54 AM  Result Value Ref Range   Sodium 139 135 - 145 mmol/L   Potassium 4.4 3.5 - 5.1 mmol/L   Chloride 104 98 - 111 mmol/L   CO2 24 22 - 32 mmol/L   Glucose, Bld 107 (H) 70 - 99 mg/dL    Comment: Glucose reference range applies only to samples taken after fasting for at least 8 hours.   BUN 12 8 - 23 mg/dL   Creatinine, Ser 1.13 0.61 - 1.24 mg/dL   Calcium 9.3 8.9 - 10.3 mg/dL   GFR, Estimated >60 >60 mL/min    Comment: (NOTE) Calculated using the CKD-EPI Creatinine Equation (2021)    Anion gap 11 5 - 15    Comment: Performed at [facility] [address], [place], [place] [address]  CBC WITH DIFFERENTIAL     Status: Abnormal   Collection Time: 01/05/23 11:54 AM  Result Value Ref Range   WBC 5.1 4.0 - 10.5 K/uL   RBC 3.83 (L) 4.22 - 5.81 MIL/uL   Hemoglobin 12.4 (L) 13.0 - 17.0 g/dL   HCT 37.8 (L) 39.0 - 52.0 %   MCV 98.7 80.0 - 100.0 fL   MCH 32.4 26.0 - 34.0 pg   MCHC 32.8 30.0 - 36.0 g/dL   RDW 14.1 11.5 - 15.5 %   Platelets 259 150 - 400 K/uL   nRBC 0.0 0.0 - 0.2 %   Neutrophils Relative % 61 %   Neutro Abs 3.1 1.7 - 7.7 K/uL   Lymphocytes Relative 25 %   Lymphs Abs 1.3 0.7 - 4.0 K/uL   Monocytes Relative 8 %   Monocytes Absolute 0.4 0.1 - 1.0 K/uL   Eosinophils Relative 5 %    Eosinophils Absolute 0.2 0.0 - 0.5 K/uL   Basophils Relative 1 %   Basophils Absolute 0.0 0.0 - 0.1 K/uL   Immature Granulocytes 0 %   Abs Immature Granulocytes 0.02 0.00 - 0.07 K/uL    Comment: Performed at [facility], [address] [place], [place] [address]  Hepatic function panel     Status: Abnormal   Collection Time: 01/05/23 11:54 AM  Result Value Ref Range   Total Protein 6.5 6.5 - 8.1 g/dL   Albumin 3.7 3.5 - 5.0 g/dL   AST 24 15 - 41 U/L   ALT  14 0 - 44 U/L   Alkaline Phosphatase 45 38 - 126 U/L   Total Bilirubin 0.1 (L) 0.3 - 1.2 mg/dL   Bilirubin, Direct 0.1 0.0 - 0.2 mg/dL   Indirect Bilirubin 0.0 (L) 0.3 - 0.9 mg/dL    Comment: Performed at [facility] [address] [place], [place] [address]  Lipase, blood     Status: None   Collection Time: 01/05/23 11:54 AM  Result Value Ref Range   Lipase 38 11 - 51 U/L    Comment: Performed at [facility] [address], [place], [place] [address]  Troponin I (High Sensitivity)     Status: None   Collection Time: 01/05/23 11:54 AM  Result Value Ref Range   Troponin I (High Sensitivity) 6 <18 ng/L    Comment: (NOTE) Elevated high sensitivity troponin I (hsTnI) values and significant  changes across serial measurements may suggest ACS but many other  chronic and acute conditions are known to elevate hsTnI results.  Refer to the "Links" section for chest pain algorithms and additional  guidance. Performed at [facility], [place] [address] [place], [place] [address]   Magnesium     Status: None   Collection Time: 01/05/23 11:54 AM  Result Value Ref Range   Magnesium 2.1 1.7 - 2.4 mg/dL    Comment: Performed at [facility] [address] , [place] [address]  Brain natriuretic peptide     Status: None   Collection Time: 01/05/23 11:55 AM  Result Value Ref Range   B Natriuretic Peptide 20.3 0.0 - 100.0 pg/mL    Comment: Performed at [facility] [address]  [place], [place] [address]  Troponin I (High Sensitivity)     Status: None   Collection Time: 01/05/23  2:26 PM  Result Value Ref Range   Troponin I (High Sensitivity) 3 <18 ng/L    Comment: (NOTE) Elevated high sensitivity troponin I (hsTnI) values and significant  changes across serial measurements may suggest ACS but many other  chronic and acute conditions are known to elevate hsTnI results.  Refer to the Links section for chest pain algorithms and additional  guidance. Performed at [facility], [place] [address] [place], [place] [address]   TSH     Status: Abnormal   Collection Time: 01/05/23  2:26 PM  Result Value Ref Range   TSH 0.023 (L) 0.350 - 4.500 uIU/mL    Comment: Performed by a 3rd Generation assay with a functional sensitivity of <=0.01 uIU/mL. Performed at [facility], [place] [address] [place], [place] [address]   Urinalysis, Routine w reflex microscopic -Urine, Clean Catch     Status: None   Collection Time: 01/05/23  4:00 PM  Result Value Ref Range   Color, Urine YELLOW YELLOW   APPearance CLEAR CLEAR   Specific Gravity, Urine 1.016 1.005 - 1.030   pH 5.0 5.0 - 8.0   Glucose, UA NEGATIVE NEGATIVE mg/dL   Hgb urine dipstick NEGATIVE NEGATIVE   Bilirubin Urine NEGATIVE NEGATIVE   Ketones, ur NEGATIVE NEGATIVE mg/dL   Protein, ur NEGATIVE NEGATIVE mg/dL   Nitrite NEGATIVE NEGATIVE   Leukocytes,Ua NEGATIVE NEGATIVE    Comment: Performed at [facility] [address], [place],  [address]  T4, free     Status: None   Collection Time: 01/05/23 11:16 PM  Result Value Ref Range   Free T4 0.77 0.61 - 1.12  ng/dL    Comment: HEMOLYSIS AT THIS LEVEL MAY AFFECT RESULT (NOTE) Biotin ingestion may interfere with free T4 tests. If the results are inconsistent with the TSH level, previous test results, or the clinical presentation, then consider biotin interference. If needed, order repeat testing after stopping biotin. Performed at [facility], [place] [address] [place], [place] [address]   Lactic acid, plasma     Status: Abnormal   Collection Time: 01/06/23  2:17 AM  Result Value Ref Range   Lactic Acid, Venous 2.5 (HH) 0.5 - 1.9 mmol/L    Comment: CRITICAL RESULT CALLED TO, READ BACK BY AND VERIFIED WITH MICHAEL BRENT RN 01/06/23 9485 Wiliam Ke Performed at [facility], [place] [address], [place], [place] [address]   CBC     Status: Abnormal   Collection Time: 01/06/23  4:46 AM  Result Value Ref Range   WBC 16.7 (H) 4.0 - 10.5 K/uL   RBC 3.29 (L) 4.22 - 5.81 MIL/uL   Hemoglobin 10.4 (L) 13.0 - 17.0 g/dL   HCT 32.7 (L) 39.0 - 52.0 %   MCV 99.4 80.0 - 100.0 fL   MCH 31.6 26.0 - 34.0 pg   MCHC 31.8 30.0 - 36.0 g/dL   RDW 13.9 11.5 - 15.5 %   Platelets 229 150 - 400 K/uL   nRBC 0.0 0.0 - 0.2 %    Comment: Performed at [facility], [place] [address], [place], [place] [address]  Comprehensive metabolic panel     Status: Abnormal   Collection Time: 01/06/23  4:46 AM  Result Value Ref Range   Sodium 139 135 - 145 mmol/L   Potassium 4.1 3.5 - 5.1 mmol/L   Chloride 105 98 - 111 mmol/L   CO2 24 22 - 32 mmol/L   Glucose, Bld 119 (H) 70 - 99 mg/dL    Comment: Glucose reference range applies only to samples taken after fasting for at least 8 hours.   BUN 13 8 - 23 mg/dL   Creatinine, Ser 1.17 0.61 - 1.24 mg/dL   Calcium 8.5 (L) 8.9 - 10.3 mg/dL   Total Protein 5.2 (L) 6.5 - 8.1 g/dL   Albumin 2.8 (L) 3.5 - 5.0 g/dL   AST 16 15 - 41 U/L   ALT 12 0 - 44 U/L   Alkaline Phosphatase 35 (L) 38 - 126 U/L   Total Bilirubin 0.5 0.3 - 1.2 mg/dL   GFR, Estimated >60 >60 mL/min    Comment: (NOTE) Calculated using the CKD-EPI Creatinine Equation (2021)    Anion gap 10 5 - 15    Comment: Performed at [facility], [place] [address], [place], [place] [address]  Lactic acid, plasma     Status: None   Collection Time: 01/06/23  4:46 AM  Result Value Ref Range   Lactic Acid, Venous 1.8 0.5 - 1.9 mmol/L    Comment:  Performed at [facility] [address], [place], [place] [address]   ECHOCARDIOGRAM COMPLETE  Result Date: 01/06/2023    ECHOCARDIOGRAM REPORT   Patient Name:   Eric Tyler Date of Exam: 01/06/2023 Medical Rec #:  [id number]       Height:       71.0 in Accession #:    [id number]      Weight:       189.0 lb Date of Birth:  02/15/1946       BSA:  2.058 m Patient Age:    84 years        BP:           102/68 mmHg Patient Gender: M               HR:           65 bpm. Exam Location:  Inpatient Procedure: 2D Echo, Cardiac Doppler and Color Doppler Indications:    syncope  History:        Patient has no prior history of Echocardiogram examinations.                 CHF; Risk Factors:Current Smoker, Diabetes and ETOH abuse.  Sonographer:    Melissa Morford RDCS (AE, PE) Referring Phys: [place]  1. Left ventricular ejection fraction, by estimation, is 55 to 60%. The left ventricle has normal function. The left ventricle has no regional wall motion abnormalities. Left ventricular diastolic parameters are indeterminate.  2. Right ventricular systolic function is normal. The right ventricular size is normal.  3. The mitral valve is normal in structure. Trivial mitral valve regurgitation.  4. The aortic valve is tricuspid. Aortic valve regurgitation is not visualized. Aortic valve sclerosis is present, with no evidence of aortic valve stenosis.  5. The inferior vena cava is normal in size with greater than 50% respiratory variability, suggesting right atrial pressure of 3 mmHg. FINDINGS  Left Ventricle: Left ventricular ejection fraction, by estimation, is 55 to 60%. The left ventricle has normal function. The left ventricle has no regional wall motion abnormalities. The left ventricular internal cavity size was normal in size. There is  no left ventricular hypertrophy. Left ventricular diastolic parameters are indeterminate. Right Ventricle: The right ventricular size is normal. No  increase in right ventricular wall thickness. Right ventricular systolic function is normal. Left Atrium: Left atrial size was normal in size. Right Atrium: Right atrial size was normal in size. Pericardium: There is no evidence of pericardial effusion. Mitral Valve: The mitral valve is normal in structure. Trivial mitral valve regurgitation. Tricuspid Valve: The tricuspid valve is normal in structure. Tricuspid valve regurgitation is trivial. Aortic Valve: The aortic valve is tricuspid. Aortic valve regurgitation is not visualized. Aortic valve sclerosis is present, with no evidence of aortic valve stenosis. Pulmonic Valve: The pulmonic valve was not well visualized. Pulmonic valve regurgitation is not visualized. Aorta: The aortic root and ascending aorta are structurally normal, with no evidence of dilitation. Venous: The inferior vena cava is normal in size with greater than 50% respiratory variability, suggesting right atrial pressure of 3 mmHg. IAS/Shunts: The interatrial septum was not well visualized.  LEFT VENTRICLE PLAX 2D LVIDd:         4.80 cm      Diastology LVIDs:         3.20 cm      LV e' medial:    6.53 cm/s LV PW:         1.00 cm      LV E/e' medial:  11.3 LV IVS:        0.90 cm      LV e' lateral:   8.27 cm/s LVOT diam:     2.00 cm      LV E/e' lateral: 9.0 LV SV:         61 LV SV Index:   30 LVOT Area:     3.14 cm  LV Volumes (MOD) LV vol d, MOD A2C: 110.0 ml LV vol  d, MOD A4C: 105.0 ml LV vol s, MOD A2C: 49.3 ml LV vol s, MOD A4C: 51.9 ml LV SV MOD A2C:     60.7 ml LV SV MOD A4C:     105.0 ml LV SV MOD BP:      57.6 ml RIGHT VENTRICLE RV S prime:     14.00 cm/s TAPSE (M-mode): 2.1 cm LEFT ATRIUM             Index        RIGHT ATRIUM           Index LA diam:        3.60 cm 1.75 cm/m   RA Area:     17.70 cm LA Vol (A2C):   41.6 ml 20.21 ml/m  RA Volume:   49.20 ml  23.90 ml/m LA Vol (A4C):   24.1 ml 11.71 ml/m LA Biplane Vol: 34.5 ml 16.76 ml/m  AORTIC VALVE LVOT Vmax:   111.00 cm/s LVOT  Vmean:  74.100 cm/s LVOT VTI:    0.195 m  AORTA Ao Root diam: 3.20 cm Ao Asc diam:  3.30 cm MITRAL VALVE               TRICUSPID VALVE MV Area (PHT): 3.99 cm    TR Peak grad:   29.2 mmHg MV Decel Time: 190 msec    TR Vmax:        270.00 cm/s MV E velocity: 74.10 cm/s MV A velocity: 84.50 cm/s  SHUNTS MV E/A ratio:  0.88        Systemic VTI:  0.20 m                            Systemic Diam: 2.00 cm Oswaldo Milian MD Electronically signed by Oswaldo Milian MD Signature Date/Time: 01/06/2023/11:54:20 AM    Final    CT Angio Chest PE W and/or Wo Contrast  Result Date: 01/05/2023 CLINICAL DATA:  Syncope/presyncope, cerebrovascular cause suspected EXAM: CT ANGIOGRAPHY CHEST WITH CONTRAST TECHNIQUE: Multidetector CT imaging of the chest was performed using the standard protocol during bolus administration of intravenous contrast. Multiplanar CT image reconstructions and MIPs were obtained to evaluate the vascular anatomy. RADIATION DOSE REDUCTION: This exam was performed according to the departmental dose-optimization program which includes automated exposure control, adjustment of the mA and/or kV according to patient size and/or use of iterative reconstruction technique. CONTRAST:  34m OMNIPAQUE IOHEXOL 350 MG/ML SOLN COMPARISON:  Chest radiograph earlier today. FINDINGS: Cardiovascular: There are no filling defects within the pulmonary arteries to suggest pulmonary embolus. Atherosclerosis of the thoracic aorta without acute aortic findings or aneurysm. Heart size upper normal. No pericardial effusion. Mediastinum/Nodes: Small mediastinal and hilar lymph nodes are not enlarged by size criteria. The esophagus is decompressed. 6 cm hypodense left thyroid nodule. This has been evaluated on previous imaging. (ref: J Am Coll Radiol. 2015 Feb;12(2): 143-50).Most recent thyroid ultrasound 08/26/2022 Lungs/Pleura: Bilateral lower lobe consolidative airspace disease. Additional patchy consolidation in the  perihilar right upper lobe and tree in bud opacities in the left upper lobe. No endobronchial lesion or debris. No significant pleural effusion. Upper Abdomen: No acute upper abdominal findings. Musculoskeletal: There are no acute or suspicious osseous abnormalities. No chest wall soft tissue abnormalities. Review of the MIP images confirms the above findings. IMPRESSION: 1. No pulmonary embolus. 2. Bilateral consolidative airspace disease, most prominent in the lower lobes, consistent with pneumonia. Additional patchy consolidation in the perihilar right upper lobe  and tree in bud opacities in the left upper lobe consistent, likely infectious. Aortic Atherosclerosis (ICD10-I70.0). Electronically Signed   By: Keith Rake M.D.   On: 01/05/2023 21:45   MR BRAIN WO CONTRAST  Result Date: 01/05/2023 CLINICAL DATA:  Mental status changes of unknown cause.  Syncope. EXAM: MRI HEAD WITHOUT CONTRAST TECHNIQUE: Multiplanar, multiecho pulse sequences of the brain and surrounding structures were obtained without intravenous contrast. COMPARISON:  Head CT earlier same day.  MRI 10/24/2018. FINDINGS: Brain: Diffusion imaging does not show any acute or subacute infarction or other cause of restricted diffusion. No focal abnormality affects the brainstem or cerebellum. Cerebral hemispheres show age related volume loss with mild chronic small-vessel ischemic change of the deep white matter. No cortical or large vessel territory infarction. No mass, hemorrhage, hydrocephalus or extra-axial collection. Vascular: Major vessels at the base of the brain show flow. Skull and upper cervical spine: Negative Sinuses/Orbits: Clear/normal Other: None IMPRESSION: No acute or reversible finding. Age related volume loss. Mild chronic small-vessel ischemic change of the cerebral hemispheric deep white matter. Electronically Signed   By: Nelson Chimes M.D.   On: 01/05/2023 19:12   CT HEAD WO CONTRAST  Result Date: 01/05/2023 CLINICAL  DATA:  Syncope EXAM: CT HEAD WITHOUT CONTRAST TECHNIQUE: Contiguous axial images were obtained from the base of the skull through the vertex without intravenous contrast. RADIATION DOSE REDUCTION: This exam was performed according to the departmental dose-optimization program which includes automated exposure control, adjustment of the mA and/or kV according to patient size and/or use of iterative reconstruction technique. COMPARISON:  None Available. FINDINGS: Brain: No evidence of acute infarction, hemorrhage, hydrocephalus, extra-axial collection or mass lesion/mass effect. There is sequela of severe chronic microvascular ischemic change. Vascular: No hyperdense vessel or unexpected calcification. Skull: Normal. Negative for fracture or focal lesion. Sinuses/Orbits: No middle ear or mastoid effusion. Paranasal sinuses are clear. Bilateral lens replacement. Orbits are otherwise unremarkable. Other: None. IMPRESSION: No acute intracranial abnormality. Electronically Signed   By: Marin Roberts M.D.   On: 01/05/2023 13:16   DG Chest Port 1 View  Result Date: 01/05/2023 CLINICAL DATA:  Cough. EXAM: PORTABLE CHEST 1 VIEW COMPARISON:  None Available. FINDINGS: Cardiac silhouette and mediastinal contours are within limits. Mild-to-moderate calcification within the aortic arch. Mild bilateral lower lung bronchovascular crowding and possible interstitial thickening. No focal airspace opacity. No pleural effusion or pneumothorax. Mild levocurvature of the upper thoracic spine. Mild-to-moderate multilevel degenerative disc changes. IMPRESSION: Mild bilateral lower lung bronchovascular crowding and possible interstitial thickening. No comparison is available. This may be secondary to subsegmental atelectasis versus mild chronic scarring. Minimal interstitial pulmonary edema is felt less likely. Electronically Signed   By: Yvonne Kendall M.D.   On: 01/05/2023 12:25    Pending Labs Unresulted Labs (From admission, onward)      Start     Ordered   01/06/23 0013  Culture, blood (Routine X 2) w Reflex to ID Panel  BLOOD CULTURE X 2,   R (with TIMED occurrences)      01/06/23 0012            Vitals/Pain Today's Vitals   01/06/23 0722 01/06/23 1022 01/06/23 1230 01/06/23 1239  BP:   117/62   Pulse:   63   Resp:   20   Temp:    97.8 F (36.6 C)  TempSrc:    Oral  SpO2:   97% 97%  Weight:      Height:      PainSc:  Asleep 0-No pain      Isolation Precautions No active isolations  Medications Medications  cefTRIAXone (ROCEPHIN) 2 g in sodium chloride 0.9 % 100 mL IVPB (has no administration in time range)  azithromycin (ZITHROMAX) 500 mg in sodium chloride 0.9 % 250 mL IVPB (has no administration in time range)  enoxaparin (LOVENOX) injection 40 mg (40 mg Subcutaneous Given 01/05/23 2309)  acetaminophen (TYLENOL) tablet 650 mg (has no administration in time range)    Or  acetaminophen (TYLENOL) suppository 650 mg (has no administration in time range)  ondansetron (ZOFRAN) tablet 4 mg (has no administration in time range)    Or  ondansetron (ZOFRAN) injection 4 mg (has no administration in time range)  aspirin EC tablet 81 mg (81 mg Oral Given 01/06/23 1017)  levothyroxine (SYNTHROID) tablet 50 mcg (50 mcg Oral Given 01/06/23 0650)  hydrOXYzine (ATARAX) tablet 10 mg (has no administration in time range)  donepezil (ARICEPT) tablet 10 mg (10 mg Oral Given 01/06/23 0102)  fenofibrate tablet 54 mg (54 mg Oral Given 05/06/66 2094)  folic acid (FOLVITE) tablet 1 mg (1 mg Oral Given 01/06/23 1018)  clobetasol ointment (TEMOVATE) 7.09 % 1 Application (1 Application Topical Given 01/06/23 1031)  rosuvastatin (CRESTOR) tablet 10 mg (has no administration in time range)  multivitamin with minerals tablet 1 tablet (1 tablet Oral Given 01/06/23 1018)  omega-3 acid ethyl esters (LOVAZA) capsule 1 g (1 g Oral Given 01/06/23 1019)  lactated ringers bolus 500 mL (0 mLs Intravenous Stopped 01/05/23 1503)  meclizine (ANTIVERT)  tablet 25 mg (25 mg Oral Given 01/05/23 1542)  ondansetron (ZOFRAN) injection 4 mg (4 mg Intravenous Given 01/05/23 1542)  lactated ringers bolus 500 mL (0 mLs Intravenous Stopped 01/05/23 1634)  iohexol (OMNIPAQUE) 350 MG/ML injection 75 mL (75 mLs Intravenous Contrast Given 01/05/23 2133)  cefTRIAXone (ROCEPHIN) 1 g in sodium chloride 0.9 % 100 mL IVPB (0 g Intravenous Stopped 01/05/23 2300)  azithromycin (ZITHROMAX) 500 mg in sodium chloride 0.9 % 250 mL IVPB (0 mg Intravenous Stopped 01/06/23 0057)  lactated ringers bolus 1,000 mL (0 mLs Intravenous Stopped 01/06/23 0208)    Mobility walks     Focused Assessments Pulmonary Assessment Handoff:  Lung sounds: Bilateral Breath Sounds: Clear O2 Device: Nasal Cannula O2 Flow Rate (L/min): 1 L/min    R Recommendations: See Admitting Provider Note  Report given to:   Additional Notes:

## 2023-01-06 NOTE — Evaluation (Signed)
Clinical/Bedside Swallow Evaluation Patient Details  Name: Eric Tyler MRN: [id number] Date of Birth: Feb 02, 1946  Today's Date: 01/06/2023 Time: SLP Start Time (ACUTE ONLY): 53 SLP Stop Time (ACUTE ONLY): 1330 SLP Time Calculation (min) (ACUTE ONLY): 22 min  Past Medical History:  Past Medical History:  Diagnosis Date   Allergy    Hyperlipidemia    Memory loss    Prediabetes    Past Surgical History:  Past Surgical History:  Procedure Laterality Date   FINGER SURGERY     Right thumb   NM MYOVIEW LTD  08/2018   Myoview: EF 48% with global HK.  Mild reversibility involving the mid anteroseptal wall suspicious for ischemia.  Intermediate risk.   HPI:  77 year old male admitted with syncopal episode followed by vomitting. Ultimately dx wtih PNA.    Assessment / Plan / Recommendation  Clinical Impression  Patient presents with what appears to be a normal oropharyngeal swallow with efficient oral transit of bolus and full oral clearance and no definite s/s of aspiration. He does have a baseline cough as well as subtle throat clearing that continues with po intake with throat clearing possibly increasing. Difficult to determine at bedside if this is due to PNA dx or possible mild dysphagia. Wife, who is a former Therapist, sports, and patient do not endorse any h/o dysphagia or s/s of aspiration. SLP will f/u briefly for skilled observation and diet tolerance. SLP Visit Diagnosis: Dysphagia, unspecified (R13.10)       Diet Recommendation Regular;Thin liquid   Liquid Administration via: Cup;Straw Medication Administration: Whole meds with liquid Supervision: Patient able to self feed Compensations: Slow rate;Small sips/bites Postural Changes: Seated upright at 90 degrees    Other  Recommendations Oral Care Recommendations: Oral care BID    Recommendations for follow up therapy are one component of a multi-disciplinary discharge planning process, led by the attending physician.   Recommendations may be updated based on patient status, additional functional criteria and insurance authorization.  Follow up Recommendations No SLP follow up         Functional Status Assessment Patient has had a recent decline in their functional status and demonstrates the ability to make significant improvements in function in a reasonable and predictable amount of time.  Frequency and Duration min 1 x/week  1 week        Swallow Study   General HPI: 77 year old male admitted with syncopal episode followed by vomitting. Ultimately dx wtih PNA. Type of Study: Bedside Swallow Evaluation Previous Swallow Assessment: none Diet Prior to this Study: Clear liquid diet Temperature Spikes Noted: No Respiratory Status: Nasal cannula History of Recent Intubation: No Behavior/Cognition: Alert;Cooperative;Pleasant mood Oral Cavity Assessment: Within Functional Limits Oral Care Completed by SLP: No Oral Cavity - Dentition: Adequate natural dentition Vision: Functional for self-feeding Self-Feeding Abilities: Able to feed self Patient Positioning: Upright in bed Baseline Vocal Quality: Hoarse (mild) Volitional Cough: Strong Volitional Swallow: Able to elicit    Oral/Motor/Sensory Function Overall Oral Motor/Sensory Function: Within functional limits   Ice Chips Ice chips: Not tested   Thin Liquid Thin Liquid: Impaired Presentation: Cup;Self Fed;Straw Pharyngeal  Phase Impairments: Throat Clearing - Delayed    Nectar Thick Nectar Thick Liquid: Not tested   Honey Thick Honey Thick Liquid: Not tested   Puree Puree: Within functional limits Presentation: Spoon;Self Fed   Solid     Solid: Impaired Presentation: Self Fed Pharyngeal Phase Impairments: Throat Clearing - Delayed     Challen Spainhour MA, CCC-SLP  Mauria Asquith Meryl 01/06/2023,1:40 PM

## 2023-01-06 NOTE — Hospital Course (Signed)
77 y.o. male with medical history significant of HLD, MCI, mycosis fungoides on Targretin.   Pt with cough over past few days, otherwise in normal state of health.  Today was accompanying wife on routine doctors office visit (for wife, not for him).  While at the [place] office, he had a witnessed syncopal episode. This was witnessed by his wife. He was seated in a chair at the time and did not suffer a fall. Wife states that his eyes were back in his head and he was unresponsive

## 2023-01-06 NOTE — Assessment & Plan Note (Addendum)
PNA with new O2 requirement. Suspect PNA may have been ongoing prior to syncopal event today as he did have a new cough for past 2 days.  Alternatively could represent aspiration PNA from the syncope and subsequent vomiting. BP on soft side in ED, early sepsis pathology is in the differential here despite nl WBC (nl WBC could be due to chemo agent he is taking for mycosis fungoides) has tachypnea but no other SIRS at this time.  Not toxic appearing, denies SOB. Ordering 1L bolus Check lactate BCx Tele monitor PNA pathway for CAP Rocephin / azithro COVID, FLU, RSV pending O2 via [place] Cont pulse ox Clear liquid diet only given the N/V earlier Zofran PRN

## 2023-01-07 ENCOUNTER — Other Ambulatory Visit (HOSPITAL_COMMUNITY): Payer: Self-pay

## 2023-01-07 DIAGNOSIS — J189 Pneumonia, unspecified organism: Secondary | ICD-10-CM | POA: Diagnosis not present

## 2023-01-07 DIAGNOSIS — C8400 Mycosis fungoides, unspecified site: Secondary | ICD-10-CM | POA: Diagnosis not present

## 2023-01-07 DIAGNOSIS — R0902 Hypoxemia: Secondary | ICD-10-CM | POA: Diagnosis not present

## 2023-01-07 DIAGNOSIS — R55 Syncope and collapse: Secondary | ICD-10-CM | POA: Diagnosis not present

## 2023-01-07 LAB — CBC
HCT: 33.1 % — ABNORMAL LOW (ref 39.0–52.0)
Hemoglobin: 10.8 g/dL — ABNORMAL LOW (ref 13.0–17.0)
MCH: 31.8 pg (ref 26.0–34.0)
MCHC: 32.6 g/dL (ref 30.0–36.0)
MCV: 97.4 fL (ref 80.0–100.0)
Platelets: 233 10*3/uL (ref 150–400)
RBC: 3.4 MIL/uL — ABNORMAL LOW (ref 4.22–5.81)
RDW: 13.8 % (ref 11.5–15.5)
WBC: 13.9 10*3/uL — ABNORMAL HIGH (ref 4.0–10.5)
nRBC: 0 % (ref 0.0–0.2)

## 2023-01-07 LAB — COMPREHENSIVE METABOLIC PANEL
ALT: 12 U/L (ref 0–44)
AST: 17 U/L (ref 15–41)
Albumin: 3 g/dL — ABNORMAL LOW (ref 3.5–5.0)
Alkaline Phosphatase: 34 U/L — ABNORMAL LOW (ref 38–126)
Anion gap: 8 (ref 5–15)
BUN: 13 mg/dL (ref 8–23)
CO2: 26 mmol/L (ref 22–32)
Calcium: 8.6 mg/dL — ABNORMAL LOW (ref 8.9–10.3)
Chloride: 102 mmol/L (ref 98–111)
Creatinine, Ser: 1.14 mg/dL (ref 0.61–1.24)
GFR, Estimated: >60 mL/min (ref >60)
Glucose, Bld: 100 mg/dL — ABNORMAL HIGH (ref 70–99)
Potassium: 3.7 mmol/L (ref 3.5–5.1)
Sodium: 136 mmol/L (ref 135–145)
Total Bilirubin: 0.9 mg/dL (ref 0.3–1.2)
Total Protein: 5.8 g/dL — ABNORMAL LOW (ref 6.5–8.1)

## 2023-01-07 MED ORDER — AMOXICILLIN-POT CLAVULANATE 875-125 MG PO TABS
1.0000 | ORAL_TABLET | Freq: Two times a day (BID) | ORAL | 0 refills | Status: AC
  Filled 2023-01-07: qty 9, 5d supply, fill #0

## 2023-01-07 MED ORDER — AZITHROMYCIN 250 MG PO TABS
250.0000 mg | ORAL_TABLET | Freq: Every day | ORAL | 0 refills | Status: AC
  Filled 2023-01-07: qty 4, 4d supply, fill #0

## 2023-01-07 NOTE — Progress Notes (Signed)
Eric Tyler to be discharged Home per MD order. Discussed prescriptions and follow up appointments with the patient. Prescriptions and  medication list explained in detail. Patient and wife verbalized understanding.  TOC pharmacy delivered medications to th family.  Skin clean, dry and intact without evidence of skin break down, no evidence of skin tears noted. IV catheter discontinued intact. Site without signs and symptoms of complications. Dressing and pressure applied. Pt denies pain at the site currently. No complaints noted.  Patient free of lines, drains, and wounds.   An After Visit Summary (AVS) was printed and given to the patient. Patient escorted via wheelchair, and discharged home via private auto.  Amaryllis Dyke, RN

## 2023-01-07 NOTE — Discharge Summary (Signed)
Physician Discharge Summary   Patient: Eric Tyler MRN: [id number] DOB: September 29, 1946  Admit date:     01/05/2023  Discharge date: 01/07/23  Discharge Physician: Edwin Dada   PCP: [name], MD     Recommendations at discharge:  Follow up with PCP Dr. Shelia Media in 1 week Follow up with Oncology as planned next week Dr. Shelia Media and Dr. Jolayne Haines:  Please repeat TSH, fT4 and T3 after resolution of this pneumonia     Discharge Diagnoses: Principal Problem:   CAP (community acquired pneumonia) Active Problems:   Syncope   Mycosis fungoides ([place])   Mild cognitive impairment   Low TSH     Hospital Course: Eric Tyler is a 77 y.o. M with mycosis fungoides on Targretin who presented with syncope.  Had had cough for few days, otherwise normal state of health.  On day of admission, was accompanying wife to her doctor's appointment when he had a witnessed syncopal episode while he was seated.    In the ER, found to have new infiltrates on CXR, leukocytosis.     * CAP (community acquired pneumonia) bilateral lower lobes Admitted with mild hypoxia, no respiratory failure.    Treated with Rocephin and azithromycin and pulmonary toilet.  Patient now mentating at baseline, taking orals.  Temp < 100 F, heart rate < 100bpm, RR < 24, SpO2 at baseline.   Stable for discharge.    Discharged to complete 5 days Augmentin and azithromycin.     Syncope Due to pneumonia.  ECG normal.  Troponins normal.  No arrhythmia on tele, MRI brain normal, CTA ruled out PE.  Mycosis fungoides ([place]) On Targretin.  Low TSH level Evidently inhibition of TSH is a known side effect of Targretin.   Here, TSH noted to be low, fT4 apparently normal  Recommend follow up by Oncology           [facility] [place] was reviewed for this patient prior to discharge.  Consultants: None Procedures performed: CTA chest Echo MRI brain   Disposition:  Home Diet recommendation:  Regular diet  DISCHARGE MEDICATION: Allergies as of 01/07/2023   No Known Allergies      Medication List     TAKE these medications    amoxicillin-clavulanate 875-125 MG tablet Commonly known as: AUGMENTIN Take 1 tablet by mouth 2 (two) times daily.   aspirin EC 81 MG tablet Take 81 mg by mouth daily.   azithromycin 250 MG tablet Commonly known as: Zithromax Z-Pak Take 1 tablet (250 mg total) by mouth daily.   bexarotene 75 MG Caps capsule Commonly known as: TARGRETIN Take 150 mg by mouth daily.   cetirizine 10 MG tablet Commonly known as: ZYRTEC Take 1 tablet (10 mg total) by mouth daily for 10 days. What changed:  when to take this reasons to take this   cholecalciferol 25 MCG (1000 UNIT) tablet Commonly known as: VITAMIN D3 Take 2,000 Units by mouth daily.   clobetasol ointment 0.05 % Commonly known as: TEMOVATE Apply 1 Application topically daily as needed (for skin cancer to keep skin moisturized).   donepezil 10 MG tablet Commonly known as: ARICEPT TAKE 1 TABLET BY MOUTH EVERYDAY AT BEDTIME What changed: See the new instructions.   fenofibrate 48 MG tablet Commonly known as: TRICOR Take 48 mg by mouth daily.   folic acid 1 MG tablet Commonly known as: FOLVITE Take 1 tablet by mouth daily.   furosemide 20 MG tablet Commonly known as: LASIX Take  20 mg by mouth daily.   hydrOXYzine 10 MG tablet Commonly known as: ATARAX Take 10 mg by mouth at bedtime as needed for itching.   IRON PO Take 1 tablet by mouth daily.   levothyroxine 50 MCG tablet Commonly known as: SYNTHROID Take 50 mcg by mouth daily before breakfast.   multivitamin with minerals Tabs tablet Take 1 tablet by mouth daily.   omega-3 acid ethyl esters 1 g capsule Commonly known as: LOVAZA Take 1 g by mouth 2 (two) times daily.   ondansetron 8 MG disintegrating tablet Commonly known as: ZOFRAN-ODT Take 1 tablet (8 mg total) by mouth every 8 (eight)  hours as needed for nausea.   rosuvastatin 10 MG tablet Commonly known as: CRESTOR Take 10 mg by mouth at bedtime.   triamcinolone cream 0.1 % Commonly known as: KENALOG Apply 1 Application topically 2 (two) times daily.   TYLENOL 8 HOUR ARTHRITIS PAIN PO Take 1 tablet by mouth 3 (three) times daily as needed (for pain).        Follow-up Information     [name], MD. Schedule an appointment as soon as possible for a visit in 1 week(s).   Specialty: Internal Medicine Contact information: [address] [place] [address] [phone]         Seegars, Olean Ree, MD Follow up.   Specialty: Internal Medicine Why: Follow up with your [place] doctor Contact information: [place] [address] [phone]                 Discharge Instructions     Discharge instructions   Complete by: As directed    **IMPORTANT DISCHARGE INSTRUCTIONS**   From Dr. Loleta Books: You were admitted with pneumonia  You should continue antibiotics with 4 more days of azithromycin and Augmentin  Take azithromycin 250 mg daily for four more days starting tomorrow morning Take amoxicillin-clavulanate (Augmentin) 875-125 mg twice daily starting tonight  Use the incentive spirometer and flutter valve several times per day Take it easy, do not drive over the weekend  Go see your Cancer Doctor next week  Resume all your home medicines, including your Targretin   Increase activity slowly   Complete by: As directed        Discharge Exam: Filed Weights   01/05/23 1126 01/06/23 1500  Weight: 85.7 kg 85.7 kg    General: Pt is alert, awake, not in acute distress Cardiovascular: RRR, nl S1-S2, no murmurs appreciated.   No LE edema.   Respiratory: Normal respiratory rate and rhythm.  CTAB without rales or wheezes. Abdominal: Abdomen soft and non-tender.  No distension or HSM.   Neuro/Psych: Strength symmetric in upper and  lower extremities.  Judgment and insight appear at baseline.   Condition at discharge: good  The results of significant diagnostics from this hospitalization (including imaging, microbiology, ancillary and laboratory) are listed below for reference.   Imaging Studies: ECHOCARDIOGRAM COMPLETE  Result Date: 01/06/2023    ECHOCARDIOGRAM REPORT   Patient Name:   Eric Tyler Date of Exam: 01/06/2023 Medical Rec #:  [id number]       Height:       71.0 in Accession #:    [id number]      Weight:       189.0 lb Date of Birth:  [birth year]       BSA:          2.058 m Patient Age:    29 years  BP:           102/68 mmHg Patient Gender: M               HR:           65 bpm. Exam Location:  Inpatient Procedure: 2D Echo, Cardiac Doppler and Color Doppler Indications:    syncope  History:        Patient has no prior history of Echocardiogram examinations.                 CHF; Risk Factors:Current Smoker, Diabetes and ETOH abuse.  Sonographer:    Melissa Morford RDCS (AE, PE) Referring Phys: [place]  1. Left ventricular ejection fraction, by estimation, is 55 to 60%. The left ventricle has normal function. The left ventricle has no regional wall motion abnormalities. Left ventricular diastolic parameters are indeterminate.  2. Right ventricular systolic function is normal. The right ventricular size is normal.  3. The mitral valve is normal in structure. Trivial mitral valve regurgitation.  4. The aortic valve is tricuspid. Aortic valve regurgitation is not visualized. Aortic valve sclerosis is present, with no evidence of aortic valve stenosis.  5. The inferior vena cava is normal in size with greater than 50% respiratory variability, suggesting right atrial pressure of 3 mmHg. FINDINGS  Left Ventricle: Left ventricular ejection fraction, by estimation, is 55 to 60%. The left ventricle has normal function. The left ventricle has no regional wall motion abnormalities. The left ventricular  internal cavity size was normal in size. There is  no left ventricular hypertrophy. Left ventricular diastolic parameters are indeterminate. Right Ventricle: The right ventricular size is normal. No increase in right ventricular wall thickness. Right ventricular systolic function is normal. Left Atrium: Left atrial size was normal in size. Right Atrium: Right atrial size was normal in size. Pericardium: There is no evidence of pericardial effusion. Mitral Valve: The mitral valve is normal in structure. Trivial mitral valve regurgitation. Tricuspid Valve: The tricuspid valve is normal in structure. Tricuspid valve regurgitation is trivial. Aortic Valve: The aortic valve is tricuspid. Aortic valve regurgitation is not visualized. Aortic valve sclerosis is present, with no evidence of aortic valve stenosis. Pulmonic Valve: The pulmonic valve was not well visualized. Pulmonic valve regurgitation is not visualized. Aorta: The aortic root and ascending aorta are structurally normal, with no evidence of dilitation. Venous: The inferior vena cava is normal in size with greater than 50% respiratory variability, suggesting right atrial pressure of 3 mmHg. IAS/Shunts: The interatrial septum was not well visualized.  LEFT VENTRICLE PLAX 2D LVIDd:         4.80 cm      Diastology LVIDs:         3.20 cm      LV e' medial:    6.53 cm/s LV PW:         1.00 cm      LV E/e' medial:  11.3 LV IVS:        0.90 cm      LV e' lateral:   8.27 cm/s LVOT diam:     2.00 cm      LV E/e' lateral: 9.0 LV SV:         61 LV SV Index:   30 LVOT Area:     3.14 cm  LV Volumes (MOD) LV vol d, MOD A2C: 110.0 ml LV vol d, MOD A4C: 105.0 ml LV vol s, MOD A2C: 49.3 ml LV vol s, MOD  A4C: 51.9 ml LV SV MOD A2C:     60.7 ml LV SV MOD A4C:     105.0 ml LV SV MOD BP:      57.6 ml RIGHT VENTRICLE RV S prime:     14.00 cm/s TAPSE (M-mode): 2.1 cm LEFT ATRIUM             Index        RIGHT ATRIUM           Index LA diam:        3.60 cm 1.75 cm/m   RA Area:      17.70 cm LA Vol (A2C):   41.6 ml 20.21 ml/m  RA Volume:   49.20 ml  23.90 ml/m LA Vol (A4C):   24.1 ml 11.71 ml/m LA Biplane Vol: 34.5 ml 16.76 ml/m  AORTIC VALVE LVOT Vmax:   111.00 cm/s LVOT Vmean:  74.100 cm/s LVOT VTI:    0.195 m  AORTA Ao Root diam: 3.20 cm Ao Asc diam:  3.30 cm MITRAL VALVE               TRICUSPID VALVE MV Area (PHT): 3.99 cm    TR Peak grad:   29.2 mmHg MV Decel Time: 190 msec    TR Vmax:        270.00 cm/s MV E velocity: 74.10 cm/s MV A velocity: 84.50 cm/s  SHUNTS MV E/A ratio:  0.88        Systemic VTI:  0.20 m                            Systemic Diam: 2.00 cm Oswaldo Milian MD Electronically signed by Oswaldo Milian MD Signature Date/Time: 01/06/2023/11:54:20 AM    Final    CT Angio Chest PE W and/or Wo Contrast  Result Date: 01/05/2023 CLINICAL DATA:  Syncope/presyncope, cerebrovascular cause suspected EXAM: CT ANGIOGRAPHY CHEST WITH CONTRAST TECHNIQUE: Multidetector CT imaging of the chest was performed using the standard protocol during bolus administration of intravenous contrast. Multiplanar CT image reconstructions and MIPs were obtained to evaluate the vascular anatomy. RADIATION DOSE REDUCTION: This exam was performed according to the departmental dose-optimization program which includes automated exposure control, adjustment of the mA and/or kV according to patient size and/or use of iterative reconstruction technique. CONTRAST:  53m OMNIPAQUE IOHEXOL 350 MG/ML SOLN COMPARISON:  Chest radiograph earlier today. FINDINGS: Cardiovascular: There are no filling defects within the pulmonary arteries to suggest pulmonary embolus. Atherosclerosis of the thoracic aorta without acute aortic findings or aneurysm. Heart size upper normal. No pericardial effusion. Mediastinum/Nodes: Small mediastinal and hilar lymph nodes are not enlarged by size criteria. The esophagus is decompressed. 6 cm hypodense left thyroid nodule. This has been evaluated on previous imaging. (ref:  J Am Coll Radiol. 2015 Feb;12(2): 143-50).Most recent thyroid ultrasound 08/26/2022 Lungs/Pleura: Bilateral lower lobe consolidative airspace disease. Additional patchy consolidation in the perihilar right upper lobe and tree in bud opacities in the left upper lobe. No endobronchial lesion or debris. No significant pleural effusion. Upper Abdomen: No acute upper abdominal findings. Musculoskeletal: There are no acute or suspicious osseous abnormalities. No chest wall soft tissue abnormalities. Review of the MIP images confirms the above findings. IMPRESSION: 1. No pulmonary embolus. 2. Bilateral consolidative airspace disease, most prominent in the lower lobes, consistent with pneumonia. Additional patchy consolidation in the perihilar right upper lobe and tree in bud opacities in the left upper lobe consistent, likely infectious. Aortic Atherosclerosis (ICD10-I70.0).  Electronically Signed   By: Keith Rake M.D.   On: 01/05/2023 21:45   MR BRAIN WO CONTRAST  Result Date: 01/05/2023 CLINICAL DATA:  Mental status changes of unknown cause.  Syncope. EXAM: MRI HEAD WITHOUT CONTRAST TECHNIQUE: Multiplanar, multiecho pulse sequences of the brain and surrounding structures were obtained without intravenous contrast. COMPARISON:  Head CT earlier same day.  MRI 10/24/2018. FINDINGS: Brain: Diffusion imaging does not show any acute or subacute infarction or other cause of restricted diffusion. No focal abnormality affects the brainstem or cerebellum. Cerebral hemispheres show age related volume loss with mild chronic small-vessel ischemic change of the deep white matter. No cortical or large vessel territory infarction. No mass, hemorrhage, hydrocephalus or extra-axial collection. Vascular: Major vessels at the base of the brain show flow. Skull and upper cervical spine: Negative Sinuses/Orbits: Clear/normal Other: None IMPRESSION: No acute or reversible finding. Age related volume loss. Mild chronic small-vessel  ischemic change of the cerebral hemispheric deep white matter. Electronically Signed   By: Nelson Chimes M.D.   On: 01/05/2023 19:12   CT HEAD WO CONTRAST  Result Date: 01/05/2023 CLINICAL DATA:  Syncope EXAM: CT HEAD WITHOUT CONTRAST TECHNIQUE: Contiguous axial images were obtained from the base of the skull through the vertex without intravenous contrast. RADIATION DOSE REDUCTION: This exam was performed according to the departmental dose-optimization program which includes automated exposure control, adjustment of the mA and/or kV according to patient size and/or use of iterative reconstruction technique. COMPARISON:  None Available. FINDINGS: Brain: No evidence of acute infarction, hemorrhage, hydrocephalus, extra-axial collection or mass lesion/mass effect. There is sequela of severe chronic microvascular ischemic change. Vascular: No hyperdense vessel or unexpected calcification. Skull: Normal. Negative for fracture or focal lesion. Sinuses/Orbits: No middle ear or mastoid effusion. Paranasal sinuses are clear. Bilateral lens replacement. Orbits are otherwise unremarkable. Other: None. IMPRESSION: No acute intracranial abnormality. Electronically Signed   By: Marin Roberts M.D.   On: 01/05/2023 13:16   DG Chest Port 1 View  Result Date: 01/05/2023 CLINICAL DATA:  Cough. EXAM: PORTABLE CHEST 1 VIEW COMPARISON:  None Available. FINDINGS: Cardiac silhouette and mediastinal contours are within limits. Mild-to-moderate calcification within the aortic arch. Mild bilateral lower lung bronchovascular crowding and possible interstitial thickening. No focal airspace opacity. No pleural effusion or pneumothorax. Mild levocurvature of the upper thoracic spine. Mild-to-moderate multilevel degenerative disc changes. IMPRESSION: Mild bilateral lower lung bronchovascular crowding and possible interstitial thickening. No comparison is available. This may be secondary to subsegmental atelectasis versus mild chronic  scarring. Minimal interstitial pulmonary edema is felt less likely. Electronically Signed   By: Yvonne Kendall M.D.   On: 01/05/2023 12:25    Microbiology: Results for orders placed or performed during the hospital encounter of 01/05/23  Resp panel by RT-PCR (RSV, Flu A&B, Covid) Anterior Nasal Swab     Status: None   Collection Time: 01/05/23 11:39 AM   Specimen: Anterior Nasal Swab  Result Value Ref Range Status   SARS Coronavirus 2 by RT PCR NEGATIVE NEGATIVE Final   Influenza A by PCR NEGATIVE NEGATIVE Final   Influenza B by PCR NEGATIVE NEGATIVE Final    Comment: (NOTE) The Xpert Xpress SARS-CoV-2/FLU/RSV plus assay is intended as an aid in the diagnosis of influenza from Nasopharyngeal swab specimens and should not be used as a sole basis for treatment. Nasal washings and aspirates are unacceptable for Xpert Xpress SARS-CoV-2/FLU/RSV testing.  Fact Sheet for Patients: EntrepreneurPulse.com.au  Fact Sheet for Healthcare Providers: IncredibleEmployment.be  This test is  not yet approved or cleared by the [place] and has been authorized for detection and/or diagnosis of SARS-CoV-2 by FDA under an Emergency Use Authorization (EUA). This EUA will remain in effect (meaning this test can be used) for the duration of the COVID-19 declaration under Section 564(b)(1) of the Act, 21 U.S.C. section 360bbb-3(b)(1), unless the authorization is terminated or revoked.     Resp Syncytial Virus by PCR NEGATIVE NEGATIVE Final    Comment: (NOTE) Fact Sheet for Patients: EntrepreneurPulse.com.au  Fact Sheet for Healthcare Providers: IncredibleEmployment.be  This test is not yet approved or cleared by the [place] FDA and has been authorized for detection and/or diagnosis of SARS-CoV-2 by FDA under an Emergency Use Authorization (EUA). This EUA will remain in effect (meaning this test can be used) for the  duration of the COVID-19 declaration under Section 564(b)(1) of the Act, 21 U.S.C. section 360bbb-3(b)(1), unless the authorization is terminated or revoked.  Performed at [facility], [place] [address] [place], [place] [address]   Culture, blood (Routine X 2) w Reflex to ID Panel     Status: None (Preliminary result)   Collection Time: 01/06/23  2:17 AM   Specimen: BLOOD  Result Value Ref Range Status   Specimen Description BLOOD RIGHT ANTECUBITAL  Final   Special Requests   Final    BOTTLES DRAWN AEROBIC AND ANAEROBIC Blood Culture results may not be optimal due to an inadequate volume of blood received in culture bottles   Culture   Final    NO GROWTH 1 DAY Performed at [facility], [place] [address] [place], [place] [address]    Report Status PENDING  Incomplete  Culture, blood (Routine X 2) w Reflex to ID Panel     Status: None (Preliminary result)   Collection Time: 01/06/23  2:17 AM   Specimen: BLOOD  Result Value Ref Range Status   Specimen Description BLOOD LEFT ANTECUBITAL  Final   Special Requests   Final    BOTTLES DRAWN AEROBIC AND ANAEROBIC Blood Culture results may not be optimal due to an inadequate volume of blood received in culture bottles   Culture   Final    NO GROWTH 1 DAY Performed at [facility], [place] [address], [place], [place] [address]    Report Status PENDING  Incomplete    Labs: CBC: Recent Labs  Lab 01/05/23 1154 01/06/23 0446 01/07/23 0352  WBC 5.1 16.7* 13.9*  NEUTROABS 3.1  --   --   HGB 12.4* 10.4* 10.8*  HCT 37.8* 32.7* 33.1*  MCV 98.7 99.4 97.4  PLT 259 229 233   Basic Metabolic Panel: Recent Labs  Lab 01/05/23 1154 01/06/23 0446 01/07/23 0352  NA 139 139 136  K 4.4 4.1 3.7  CL 104 105 102  CO2 '24 24 26  '$ GLUCOSE 107* 119* 100*  BUN '12 13 13  '$ CREATININE 1.13 1.17 1.14  CALCIUM 9.3 8.5* 8.6*  MG 2.1  --   --    Liver Function Tests: Recent Labs  Lab 01/05/23 1154 01/06/23 0446 01/07/23 0352  AST  '24 16 17  '$ ALT '14 12 12  '$ ALKPHOS 45 35* 34*  BILITOT 0.1* 0.5 0.9  PROT 6.5 5.2* 5.8*  ALBUMIN 3.7 2.8* 3.0*   CBG: Recent Labs  Lab 01/05/23 1143  GLUCAP 95    Discharge time spent: approximately 35 minutes spent on discharge counseling, evaluation of patient on day of discharge, and coordination of discharge planning with  nursing, social work, pharmacy and case management  Signed: Edwin Dada, MD [facility] 01/07/2023

## 2023-01-07 NOTE — TOC Transition Note (Signed)
Transition of Care [facility]) - CM/SW Discharge Note   Patient Details  Name: Eric Tyler MRN: [id number] Date of Birth: October 25, 1946  [facility] CM/SW Contact:  Tom-Johnson, Renea Ee, RN Phone Number: 01/07/2023, 10:15 AM   Clinical Narrative:     Patient is scheduled for discharge today. No TOC needs or recommendations noted. Family to transport at discharge. No further TOC needs noted.           Final next level of care: Home/Self Care Barriers to Discharge: Barriers Resolved   Patient Goals and CMS Choice CMS Medicare.gov Compare Post Acute Care list provided to:: Patient Choice offered to / list presented to : NA  Discharge Placement                  Patient to be transferred to facility by: Family      Discharge Plan and Services Additional resources added to the After Visit Summary for                  DME Arranged: N/A DME Agency: NA       HH Arranged: NA HH Agency: NA        Social Determinants of Health (SDOH) Interventions SDOH Screenings   Food Insecurity: No Food Insecurity (01/06/2023)  Housing: Low Risk  (01/06/2023)  Transportation Needs: No Transportation Needs (01/06/2023)  Utilities: Not At Risk (01/06/2023)  Tobacco Use: Low Risk  (01/05/2023)     Readmission Risk Interventions     No data to display

## 2023-01-07 NOTE — Progress Notes (Signed)
   01/07/23 0859  Mobility  Activity Ambulated independently in hallway  Range of Motion/Exercises Active;All extremities  Level of Assistance Independent  Assistive Device None  Distance Ambulated (ft) 200 ft  Activity Response Tolerated well   Pt ambulated in the hallway on room air.  Oxygen saturations 96-99% during ambulation.

## 2023-01-11 LAB — CULTURE, BLOOD (ROUTINE X 2)
Culture: NO GROWTH
Culture: NO GROWTH

## 2023-01-13 DIAGNOSIS — Z79899 Other long term (current) drug therapy: Secondary | ICD-10-CM | POA: Diagnosis not present

## 2023-01-13 DIAGNOSIS — Z7969 Long term (current) use of other immunomodulators and immunosuppressants: Secondary | ICD-10-CM | POA: Diagnosis not present

## 2023-01-13 DIAGNOSIS — C8400 Mycosis fungoides, unspecified site: Secondary | ICD-10-CM | POA: Diagnosis not present

## 2023-01-19 DIAGNOSIS — Z5181 Encounter for therapeutic drug level monitoring: Secondary | ICD-10-CM | POA: Diagnosis not present

## 2023-01-19 DIAGNOSIS — C8400 Mycosis fungoides, unspecified site: Secondary | ICD-10-CM | POA: Diagnosis not present

## 2023-01-21 DIAGNOSIS — Z09 Encounter for follow-up examination after completed treatment for conditions other than malignant neoplasm: Secondary | ICD-10-CM | POA: Diagnosis not present

## 2023-01-21 DIAGNOSIS — E039 Hypothyroidism, unspecified: Secondary | ICD-10-CM | POA: Diagnosis not present

## 2023-01-21 DIAGNOSIS — J189 Pneumonia, unspecified organism: Secondary | ICD-10-CM | POA: Diagnosis not present

## 2023-02-22 DIAGNOSIS — J189 Pneumonia, unspecified organism: Secondary | ICD-10-CM | POA: Diagnosis not present

## 2023-02-22 DIAGNOSIS — Z8701 Personal history of pneumonia (recurrent): Secondary | ICD-10-CM | POA: Diagnosis not present

## 2023-02-22 DIAGNOSIS — E039 Hypothyroidism, unspecified: Secondary | ICD-10-CM | POA: Diagnosis not present

## 2023-02-25 DIAGNOSIS — H10503 Unspecified blepharoconjunctivitis, bilateral: Secondary | ICD-10-CM | POA: Diagnosis not present

## 2023-03-05 DIAGNOSIS — E782 Mixed hyperlipidemia: Secondary | ICD-10-CM | POA: Diagnosis not present

## 2023-03-05 DIAGNOSIS — R7303 Prediabetes: Secondary | ICD-10-CM | POA: Diagnosis not present

## 2023-03-05 DIAGNOSIS — F0390 Unspecified dementia without behavioral disturbance: Secondary | ICD-10-CM | POA: Diagnosis not present

## 2023-03-05 DIAGNOSIS — I10 Essential (primary) hypertension: Secondary | ICD-10-CM | POA: Diagnosis not present

## 2023-03-20 ENCOUNTER — Other Ambulatory Visit: Payer: Self-pay

## 2023-03-20 ENCOUNTER — Emergency Department (HOSPITAL_COMMUNITY)
Admission: EM | Admit: 2023-03-20 | Discharge: 2023-03-20 | Disposition: A | Discharge status: Home / Self Care | Payer: Medicare HMO | Attending: Student | Admitting: Student

## 2023-03-20 ENCOUNTER — Encounter (HOSPITAL_COMMUNITY): Payer: Self-pay | Admitting: Emergency Medicine

## 2023-03-20 ENCOUNTER — Telehealth (HOSPITAL_COMMUNITY): Payer: Self-pay | Admitting: Emergency Medicine

## 2023-03-20 ENCOUNTER — Encounter (HOSPITAL_COMMUNITY): Payer: Self-pay

## 2023-03-20 ENCOUNTER — Ambulatory Visit (HOSPITAL_COMMUNITY)
Admission: EM | Admit: 2023-03-20 | Discharge: 2023-03-20 | Disposition: A | Discharge status: Home / Self Care | Payer: Medicare HMO | Attending: Physician Assistant | Admitting: Physician Assistant

## 2023-03-20 ENCOUNTER — Emergency Department (HOSPITAL_COMMUNITY): Payer: Medicare HMO

## 2023-03-20 DIAGNOSIS — S61210A Laceration without foreign body of right index finger without damage to nail, initial encounter: Secondary | ICD-10-CM | POA: Diagnosis not present

## 2023-03-20 DIAGNOSIS — R519 Headache, unspecified: Secondary | ICD-10-CM | POA: Diagnosis not present

## 2023-03-20 DIAGNOSIS — S61219A Laceration without foreign body of unspecified finger without damage to nail, initial encounter: Secondary | ICD-10-CM

## 2023-03-20 DIAGNOSIS — W293XXA Contact with powered garden and outdoor hand tools and machinery, initial encounter: Secondary | ICD-10-CM | POA: Diagnosis not present

## 2023-03-20 DIAGNOSIS — I10 Essential (primary) hypertension: Secondary | ICD-10-CM | POA: Diagnosis not present

## 2023-03-20 DIAGNOSIS — R55 Syncope and collapse: Secondary | ICD-10-CM

## 2023-03-20 DIAGNOSIS — S61411A Laceration without foreign body of right hand, initial encounter: Secondary | ICD-10-CM | POA: Diagnosis not present

## 2023-03-20 DIAGNOSIS — S61212A Laceration without foreign body of right middle finger without damage to nail, initial encounter: Secondary | ICD-10-CM | POA: Insufficient documentation

## 2023-03-20 LAB — CBG MONITORING, ED: Glucose-Capillary: 94 mg/dL (ref 70–99)

## 2023-03-20 LAB — CBC
HCT: 41.1 % (ref 39.0–52.0)
Hemoglobin: 13.2 g/dL (ref 13.0–17.0)
MCH: 30.9 pg (ref 26.0–34.0)
MCHC: 32.1 g/dL (ref 30.0–36.0)
MCV: 96.3 fL (ref 80.0–100.0)
Platelets: 343 10*3/uL (ref 150–400)
RBC: 4.27 MIL/uL (ref 4.22–5.81)
RDW: 13.5 % (ref 11.5–15.5)
WBC: 3.2 10*3/uL — ABNORMAL LOW (ref 4.0–10.5)
nRBC: 0 % (ref 0.0–0.2)

## 2023-03-20 LAB — BASIC METABOLIC PANEL
Anion gap: 13 (ref 5–15)
BUN: 12 mg/dL (ref 8–23)
CO2: 23 mmol/L (ref 22–32)
Calcium: 9.8 mg/dL (ref 8.9–10.3)
Chloride: 104 mmol/L (ref 98–111)
Creatinine, Ser: 1.4 mg/dL — ABNORMAL HIGH (ref 0.61–1.24)
GFR, Estimated: 51 mL/min — ABNORMAL LOW (ref >60)
Glucose, Bld: 92 mg/dL (ref 70–99)
Potassium: 4.3 mmol/L (ref 3.5–5.1)
Sodium: 140 mmol/L (ref 135–145)

## 2023-03-20 LAB — URINALYSIS, ROUTINE W REFLEX MICROSCOPIC
Bilirubin Urine: NEGATIVE
Glucose, UA: NEGATIVE mg/dL
Hgb urine dipstick: NEGATIVE
Ketones, ur: NEGATIVE mg/dL
Leukocytes,Ua: NEGATIVE
Nitrite: NEGATIVE
Protein, ur: NEGATIVE mg/dL
Specific Gravity, Urine: 1.014 (ref 1.005–1.030)
pH: 5 (ref 5.0–8.0)

## 2023-03-20 LAB — POCT FASTING CBG KUC MANUAL ENTRY: POCT Glucose (KUC): 99 mg/dL (ref 70–99)

## 2023-03-20 MED ORDER — LIDOCAINE-EPINEPHRINE (PF) 2 %-1:200000 IJ SOLN
10.0000 mL | Freq: Once | INTRAMUSCULAR | Status: AC
  Administered 2023-03-20: 10 mL via INTRADERMAL
  Filled 2023-03-20: qty 20

## 2023-03-20 MED ORDER — LACTATED RINGERS IV BOLUS
1000.0000 mL | Freq: Once | INTRAVENOUS | Status: AC
  Administered 2023-03-20: 1000 mL via INTRAVENOUS

## 2023-03-20 NOTE — ED Triage Notes (Signed)
Pt BIB EMS from urgent care for laceration to 2nd and 3rd finger on R hand, pt was soaking hand and staff heard a fall and found pt on floor unresponsive. Abrasion above L eyebrow. Pt arrives A&Ox4.  EMS VS: 152/90 HR 64 98% RA

## 2023-03-20 NOTE — ED Notes (Signed)
Reportedly, patient had been sitting on table soaking right hand.  Patient was alone and now reports he felt hot, overheated and must have passed out.  Patient when this nurse arrived in treatment room, patient lying on his back, feet elevated and answering questions appropriately.  Currently, patient is sitting in chair.  Denies pain.  Has not eaten since breakfast.  There is an abrasion to left forehead.  Patient is alert and oriented x 3 follows commands and answering questions appropriately

## 2023-03-20 NOTE — ED Triage Notes (Signed)
Pt presents to office for laceration to on right hand. 2nd and 3rd fingers. Pt reports he was pulling up weeds.

## 2023-03-20 NOTE — ED Notes (Signed)
Patient is being discharged from the Urgent Care and sent to the Emergency Department via [facility] . Per Dorann Ou, PA, patient is in need of higher level of care due to head injury/syncope. Patient is aware and verbalizes understanding of plan of care.  Vitals:   03/20/23 1516 03/20/23 1623  BP: (!) 141/84 (!) 147/77  Pulse: 81 60  Resp: 16 (!) 22  Temp: 98.1 F (36.7 C)   SpO2: 98% 98%

## 2023-03-20 NOTE — Telephone Encounter (Signed)
Wife of patient arrived as [facility] was transporting patient.  Dorann Ou, PA explained to wife what had happened and what was taking place with the transfer

## 2023-03-20 NOTE — ED Provider Notes (Signed)
[facility]    CSN: [id number] Arrival date & time: 03/20/23  1423      History   Chief Complaint No chief complaint on file.   HPI Eric Tyler is a 77 y.o. male.   Patient presents today with a several hour history of laceration to his right flank and third fingers.  Reports that he was working in the garden when he pulled a piece of vegetation that caused the laceration.  He did not clean it with anything specific.  He is right-handed.  Denies any numbness or paresthesias.  He is able to move the hand without difficulty.  He is up-to-date with his tetanus with last one documented 08/22/2018.    Past Medical History:  Diagnosis Date   Allergy    Hyperlipidemia    Memory loss    Prediabetes     Patient Active Problem List   Diagnosis Date Noted   Mycosis fungoides 01/06/2023   Hypoxia 01/06/2023   Near syncope 01/06/2023   CAP (community acquired pneumonia) 01/05/2023   Syncope 01/05/2023   Low TSH level 01/05/2023   Arcus senilis of both eyes 07/09/2021   Atherosclerotic heart disease of native coronary artery without angina pectoris 07/09/2021   Body mass index (BMI) 27.0-27.9, adult 07/09/2021   Eczema 07/09/2021   Leukopenia 07/09/2021   Mild cognitive disorder 07/09/2021   Non-toxic multinodular goiter 07/09/2021   Pure hypercholesterolemia 07/09/2021   Bilateral hand pain 07/09/2021   Bilateral foot pain 07/09/2021   Abnormal nuclear stress test 05/02/2020   Edema 05/02/2020   Mild cognitive impairment 10/04/2018    Past Surgical History:  Procedure Laterality Date   FINGER SURGERY     Right thumb   NM MYOVIEW LTD  08/2018   Myoview: EF 48% with global HK.  Mild reversibility involving the mid anteroseptal wall suspicious for ischemia.  Intermediate risk.       Home Medications    Prior to Admission medications   Medication Sig Start Date End Date Taking? Authorizing Provider  Acetaminophen (TYLENOL 8 HOUR ARTHRITIS PAIN PO) Take  1 tablet by mouth 3 (three) times daily as needed (for pain). 09/24/17   [provider]  amoxicillin-clavulanate (AUGMENTIN) 875-125 MG tablet Take 1 tablet by mouth 2 (two) times daily. 01/07/23   Danford, Earl Lites, MD  aspirin EC 81 MG tablet Take 81 mg by mouth daily.    [provider]  azithromycin (ZITHROMAX Z-PAK) 250 MG tablet Take 1 tablet (250 mg total) by mouth daily. 01/07/23   Danford, Earl Lites, MD  bexarotene (TARGRETIN) 75 MG CAPS capsule Take 150 mg by mouth daily.    [provider]  cetirizine (ZYRTEC) 10 MG tablet Take 1 tablet (10 mg total) by mouth daily for 10 days. Patient taking differently: Take 10 mg by mouth daily as needed for allergies. 11/05/17 01/07/24  Georgina Quint, MD  cholecalciferol (VITAMIN D3) 25 MCG (1000 UNIT) tablet Take 2,000 Units by mouth daily.    [provider]  clobetasol ointment (TEMOVATE) 0.05 % Apply 1 Application topically daily as needed (for skin cancer to keep skin moisturized). 07/07/21   [provider]  donepezil (ARICEPT) 10 MG tablet TAKE 1 TABLET BY MOUTH EVERYDAY AT BEDTIME Patient taking differently: Take 10 mg by mouth at bedtime. 10/16/20   Glean Salvo, NP  fenofibrate (TRICOR) 48 MG tablet Take 48 mg by mouth daily.    [provider]  Ferrous Sulfate (IRON PO) Take 1  tablet by mouth daily.    [provider]  folic acid (FOLVITE) 1 MG tablet Take 1 tablet by mouth daily. 11/10/22 11/10/23  [provider]  furosemide (LASIX) 20 MG tablet Take 20 mg by mouth daily. 05/12/21   [provider]  hydrOXYzine (ATARAX) 10 MG tablet Take 10 mg by mouth at bedtime as needed for itching. 12/28/22   [provider]  levothyroxine (SYNTHROID) 50 MCG tablet Take 50 mcg by mouth daily before breakfast. 10/29/22   [provider]  Multiple Vitamin (MULTIVITAMIN WITH MINERALS) TABS tablet Take 1 tablet by mouth daily.    [provider]  omega-3 acid ethyl esters (LOVAZA) 1 G capsule Take 1 g by mouth 2 (two) times daily.    [provider]  ondansetron (ZOFRAN-ODT) 8 MG disintegrating tablet Take 1 tablet (8 mg total) by mouth every 8 (eight) hours as needed for nausea. 07/18/22   Derwood Kaplan, MD  rosuvastatin (CRESTOR) 10 MG tablet Take 10 mg by mouth at bedtime. 12/10/22   [provider]  triamcinolone cream (KENALOG) 0.1 % Apply 1 Application topically 2 (two) times daily.    [provider]    Family History Family History  Problem Relation Age of Onset   Cancer Mother    Hypertension Mother    Hypertension Father    Hyperlipidemia Father    Hypertension Son     Social History Social History   Tobacco Use   Smoking status: Never   Smokeless tobacco: Never  Vaping Use   Vaping Use: Never used  Substance Use Topics   Alcohol use: No    Alcohol/week: 0.0 standard drinks of alcohol   Drug use: No     Allergies   Patient has no known allergies.   Review of Systems Review of Systems   Physical Exam Triage Vital Signs ED Triage Vitals [03/20/23 1516]  Enc Vitals Group     BP (!) 141/84     Pulse Rate 81     Resp 16     Temp 98.1 F (36.7 C)     Temp Source Oral     SpO2 98 %     Weight      Height      Head Circumference      Peak Flow      Pain Score 0     Pain Loc      Pain Edu?      Excl. in GC?    No data found.  Updated Vital Signs BP (!) 147/77 (BP Location: Right Arm)   Pulse 60   Temp 98.1 F (36.7 C) (Oral)   Resp (!) 22   SpO2 98%   Visual Acuity Right Eye Distance:   Left Eye Distance:   Bilateral Distance:    Right Eye Near:   Left Eye Near:    Bilateral Near:     Physical Exam Vitals reviewed.  Constitutional:      General: He is awake.     Appearance: Normal appearance. He is well-developed. He is not ill-appearing.     Comments: Very pleasant male appears stated age in no acute distress and comfortably in  exam room  HENT:     Head: Normocephalic.      Mouth/Throat:     Pharynx: No oropharyngeal exudate, posterior oropharyngeal erythema or uvula swelling.  Cardiovascular:     Rate and Rhythm: Normal rate and regular rhythm.     Heart sounds: Normal  heart sounds, S1 normal and S2 normal. No murmur heard. Pulmonary:     Effort: Pulmonary effort is normal.     Breath sounds: Normal breath sounds. No stridor. No wheezing, rhonchi or rales.     Comments: Clear to auscultation bilaterally Abdominal:     Palpations: Abdomen is soft.     Tenderness: There is no abdominal tenderness.  Musculoskeletal:     Right hand: Laceration present.     Comments: Laceration noted right second and third finger.  Neurological:     Mental Status: He is alert.  Psychiatric:        Behavior: Behavior is cooperative.      UC Treatments / Results  Labs (all labs ordered are listed, but only abnormal results are displayed) Labs Reviewed  POCT FASTING CBG [facility] MANUAL ENTRY    EKG   Radiology No results found.  Procedures Procedures (including critical care time)  Medications Ordered in UC Medications - No data to display  Initial Impression / Assessment and Plan / UC Course  I have reviewed the triage vital signs and the nursing notes.  Pertinent labs & imaging results that were available during my care of the patient were reviewed by me and considered in my medical decision making (see chart for details).     Patient had syncopal episode during visit.  He was soaking his finger awaiting closure when staff heard a fall and he was found unconscious on the floor.  He quickly came to and was noted to have a small abrasion on his left forehead.  Blood sugar was checked and was 99.  Oxygen remained at 98% and his pulse was 65.  He felt much better after laying for a few minutes and was able to be assisted to a chair.  Denied any headache or neck pain.  He does have a history of a syncopal episode in  the past but reports that this was only when he was very sick (he had pneumonia).  He was transported to the emergency room for further evaluation and management given his age and likely head injury by EMS.  He was stable at the time of discharge.  Final Clinical Impressions(s) / UC Diagnoses   Final diagnoses:  Syncope and collapse  Laceration of multiple sites of right hand and fingers, initial encounter   Discharge Instructions   None    ED Prescriptions   None    PDMP not reviewed this encounter.   Jeani Hawking, PA-C 03/20/23 1640

## 2023-03-20 NOTE — ED Provider Triage Note (Signed)
Emergency Medicine Provider Triage Evaluation Note  Eric Tyler , a 77 y.o. male  was evaluated in triage.  Pt complains of syncope.  Patient was at urgent care after cutting his right hand with clippers.  Says he was soaking his hand in water and then had a syncopal event.  No preceding symptoms.  Says that they found him on the ground.  Does have a small bump to his left forehead as well..  Review of Systems  Positive: Syncope Negative: Chest pain, shortness of breath  Physical Exam  BP (!) 143/76 (BP Location: Left Arm)   Pulse (!) 56   Temp 97.7 F (36.5 C) (Oral)   Resp 18   Ht  (1.854 m)   Wt 85.7 kg   SpO2 100%   BMI 24.93 kg/m  Gen:   Awake, no distress  Resp:  Normal effort  MSK:   Moves extremities without difficulty right fingers bandage Other:  Small ration to left forehead  Medical Decision Making  Medically screening exam initiated at 6:04 PM.  Appropriate orders placed.  Juliane Lack was informed that the remainder of the evaluation will be completed by another provider, this initial triage assessment does not replace that evaluation, and the importance of remaining in the ED until their evaluation is complete.   Rondel Baton, MD 03/20/23 [phone]

## 2023-03-21 NOTE — ED Provider Notes (Signed)
[place] EMERGENCY DEPARTMENT AT [facility] Provider Note  CSN: [id number] Arrival date & time: 03/20/23 1651  Chief Complaint(s) Loss of Consciousness  HPI Eric Tyler is a 77 y.o. male with PMH HLD, prediabetes who presents emergency department for evaluation of a finger laceration and loss of consciousness.  Patient was reportedly trimming the hedges when he suffered a laceration to his right second and third fingers.  He went to urgent care and when his hand was placed in a bowl of cold water he suffered a syncopal event falling and striking his head on the ground.  He states that he felt the significant mount of pain when he put his hand in the water and thinks that this may be the reason he passed out.  Here in the emergency room, patient is alert and oriented answering all questions appropriately with no complaints of chest pain, shortness of breath, abdominal pain, nausea, vomiting or other systemic symptoms.   Past Medical History Past Medical History:  Diagnosis Date   Allergy    Hyperlipidemia    Memory loss    Prediabetes    Patient Active Problem List   Diagnosis Date Noted   Mycosis fungoides 01/06/2023   Hypoxia 01/06/2023   Near syncope 01/06/2023   CAP (community acquired pneumonia) 01/05/2023   Syncope 01/05/2023   Low TSH level 01/05/2023   Arcus senilis of both eyes 07/09/2021   Atherosclerotic heart disease of native coronary artery without angina pectoris 07/09/2021   Body mass index (BMI) 27.0-27.9, adult 07/09/2021   Eczema 07/09/2021   Leukopenia 07/09/2021   Mild cognitive disorder 07/09/2021   Non-toxic multinodular goiter 07/09/2021   Pure hypercholesterolemia 07/09/2021   Bilateral hand pain 07/09/2021   Bilateral foot pain 07/09/2021   Abnormal nuclear stress test 05/02/2020   Edema 05/02/2020   Mild cognitive impairment 10/04/2018   Home Medication(s) Prior to Admission medications   Medication Sig Start Date End Date Taking?  Authorizing Provider  Acetaminophen (TYLENOL 8 HOUR ARTHRITIS PAIN PO) Take 1 tablet by mouth 3 (three) times daily as needed (for pain). 09/24/17   [provider]  amoxicillin-clavulanate (AUGMENTIN) 875-125 MG tablet Take 1 tablet by mouth 2 (two) times daily. 01/07/23   Danford, Earl Lites, MD  aspirin EC 81 MG tablet Take 81 mg by mouth daily.    [provider]  azithromycin (ZITHROMAX Z-PAK) 250 MG tablet Take 1 tablet (250 mg total) by mouth daily. 01/07/23   Danford, Earl Lites, MD  bexarotene (TARGRETIN) 75 MG CAPS capsule Take 150 mg by mouth daily.    [provider]  cetirizine (ZYRTEC) 10 MG tablet Take 1 tablet (10 mg total) by mouth daily for 10 days. Patient taking differently: Take 10 mg by mouth daily as needed for allergies. 11/05/17 01/07/24  Georgina Quint, MD  cholecalciferol (VITAMIN D3) 25 MCG (1000 UNIT) tablet Take 2,000 Units by mouth daily.    [provider]  clobetasol ointment (TEMOVATE) 0.05 % Apply 1 Application topically daily as needed (for skin cancer to keep skin moisturized). 07/07/21   [provider]  donepezil (ARICEPT) 10 MG tablet TAKE 1 TABLET BY MOUTH EVERYDAY AT BEDTIME Patient taking differently: Take 10 mg by mouth at bedtime. 10/16/20   Glean Salvo, NP  fenofibrate (TRICOR) 48 MG tablet Take 48 mg by mouth daily.    [provider]  Ferrous Sulfate (IRON PO) Take 1 tablet by mouth daily.    [provider]  folic acid (FOLVITE) 1 MG tablet Take 1 tablet by mouth daily. 11/10/22 11/10/23  [provider]  furosemide (LASIX) 20 MG tablet Take 20 mg by mouth daily. 05/12/21   [provider]  hydrOXYzine (ATARAX) 10 MG tablet Take 10 mg by mouth at bedtime as needed for itching. 12/28/22   [provider]  levothyroxine (SYNTHROID) 50 MCG tablet Take 50 mcg by mouth daily before breakfast. 10/29/22   [provider]  Multiple Vitamin (MULTIVITAMIN  WITH MINERALS) TABS tablet Take 1 tablet by mouth daily.    [provider]  omega-3 acid ethyl esters (LOVAZA) 1 G capsule Take 1 g by mouth 2 (two) times daily.    [provider]  ondansetron (ZOFRAN-ODT) 8 MG disintegrating tablet Take 1 tablet (8 mg total) by mouth every 8 (eight) hours as needed for nausea. 07/18/22   Derwood Kaplan, MD  rosuvastatin (CRESTOR) 10 MG tablet Take 10 mg by mouth at bedtime. 12/10/22   [provider]  triamcinolone cream (KENALOG) 0.1 % Apply 1 Application topically 2 (two) times daily.    [provider]                                                                                                                                    Past Surgical History Past Surgical History:  Procedure Laterality Date   FINGER SURGERY     Right thumb   NM MYOVIEW LTD  08/2018   Myoview: EF 48% with global HK.  Mild reversibility involving the mid anteroseptal wall suspicious for ischemia.  Intermediate risk.   Family History Family History  Problem Relation Age of Onset   Cancer Mother    Hypertension Mother    Hypertension Father    Hyperlipidemia Father    Hypertension Son     Social History Social History   Tobacco Use   Smoking status: Never   Smokeless tobacco: Never  Vaping Use   Vaping Use: Never used  Substance Use Topics   Alcohol use: No    Alcohol/week: 0.0 standard drinks of alcohol   Drug use: No   Allergies Patient has no known allergies.  Review of Systems Review of Systems  Skin:  Positive for wound.  Neurological:  Positive for syncope.    Physical Exam Vital Signs  I have reviewed the triage vital signs BP (!) 161/78 (BP Location: Left Arm)   Pulse (!) 51   Temp 98.5 F (36.9 C) (Oral)   Resp 16   Ht 6\' 1"  (1.854 m)   Wt 85.7 kg   SpO2 100%   BMI 24.93 kg/m   Physical Exam Constitutional:      General: He is not in acute distress.    Appearance: Normal appearance.  HENT:      Head: Normocephalic and atraumatic.     Nose: No congestion or rhinorrhea.  Eyes:     General:  Right eye: No discharge.        Left eye: No discharge.     Extraocular Movements: Extraocular movements intact.     Pupils: Pupils are equal, round, and reactive to light.  Cardiovascular:     Rate and Rhythm: Normal rate and regular rhythm.     Heart sounds: No murmur heard. Pulmonary:     Effort: No respiratory distress.     Breath sounds: No wheezing or rales.  Abdominal:     General: There is no distension.     Tenderness: There is no abdominal tenderness.  Musculoskeletal:        General: Normal range of motion.     Cervical back: Normal range of motion.  Skin:    General: Skin is warm and dry.     Findings: Lesion present.  Neurological:     General: No focal deficit present.     Mental Status: He is alert.     ED Results and Treatments Labs (all labs ordered are listed, but only abnormal results are displayed) Labs Reviewed  BASIC METABOLIC PANEL - Abnormal; Notable for the following components:      Result Value   Creatinine, Ser 1.40 (*)    GFR, Estimated 51 (*)    All other components within normal limits  CBC - Abnormal; Notable for the following components:   WBC 3.2 (*)    All other components within normal limits  URINALYSIS, ROUTINE W REFLEX MICROSCOPIC - Abnormal; Notable for the following components:   APPearance HAZY (*)    All other components within normal limits  CBG MONITORING, ED                                                                                                                          Radiology CT Head Wo Contrast  Result Date: 03/20/2023 CLINICAL DATA:  Head trauma with pain. EXAM: CT HEAD WITHOUT CONTRAST TECHNIQUE: Contiguous axial images were obtained from the base of the skull through the vertex without intravenous contrast. RADIATION DOSE REDUCTION: This exam was performed according to the departmental dose-optimization  program which includes automated exposure control, adjustment of the mA and/or kV according to patient size and/or use of iterative reconstruction technique. COMPARISON:  CT head dated 01/05/2023. FINDINGS: Brain: No evidence of acute infarction, hemorrhage, hydrocephalus, extra-axial collection or mass lesion/mass effect. Periventricular white matter hypoattenuation likely represents chronic small vessel ischemic disease. Vascular: There are vascular calcifications in the carotid siphons. Skull: Normal. Negative for fracture or focal lesion. Sinuses/Orbits: No acute finding. Other: None. IMPRESSION: No acute intracranial process. Electronically Signed   By: Romona Curls M.D.   On: 03/20/2023 19:00    Pertinent labs & imaging results that were available during my care of the patient were reviewed by me and considered in my medical decision making (see MDM for details).  Medications Ordered in ED Medications  lactated ringers bolus 1,000 mL (0 mLs Intravenous Stopped 03/20/23 2221)  lidocaine-EPINEPHrine (XYLOCAINE  W/EPI) 2 %-1:200000 (PF) injection 10 mL (10 mLs Intradermal Given 03/20/23 2120)                                                                                                                                     Procedures .Marland KitchenLaceration Repair  Date/Time: 03/21/2023 1:27 AM  Performed by: Glendora Score, MD Authorized by: Glendora Score, MD   Anesthesia:    Anesthesia method:  Nerve block   Block anesthetic:  Lidocaine 2% WITH epi   Block technique:  Digital block   Block injection procedure:  Anatomic landmarks identified, introduced needle, anatomic landmarks palpated and negative aspiration for blood   Block outcome:  Anesthesia achieved Laceration details:    Location:  Finger   Finger location:  R long finger   Length (cm):  2 Pre-procedure details:    Preparation:  Patient was prepped and draped in usual sterile fashion Treatment:    Area cleansed with:  Soap and  water   Amount of cleaning:  Standard Skin repair:    Repair method:  Sutures   Suture size:  5-0   Suture material:  Prolene   Suture technique:  Simple interrupted   Number of sutures:  4 Approximation:    Approximation:  Close Repair type:    Repair type:  Simple Post-procedure details:    Dressing:  Non-adherent dressing   (including critical care time)  Medical Decision Making / ED Course   This patient presents to the ED for concern of syncope, head injury, laceration, this involves an extensive number of treatment options, and is a complaint that carries with it a high risk of complications and morbidity.  The differential diagnosis includes vasovagal syncope, orthostatic syncope, cardiogenic syncope, electrolyte abnormality, laceration, contusion, hematoma, fracture, closed head injury, ICH  MDM: Patient seen emergency room for evaluation of multiple complaints described above.  Physical exam with a 2 cm laceration over the third digits and a smaller, superficial laceration over the second digit on the right.  He also has a small abrasion to the left forehead.  Laboratory evaluation with a mild leukopenia to 3.2, new creatinine elevation of 1.4 but is otherwise unremarkable.  Fluid resuscitation performed with lactated Ringer's.  CT head with no evidence of traumatic injury.  ECG with sinus bradycardia and no evidence of heart block.  Digital block performed and third digit laceration repaired with Prolene sutures.  Suspect vasovagal syncope today at urgent care given painful stimulus prior to syncopal episode.  Patient will require return in 1 week for suture removal but at this time does not meet inpatient criteria for admission.  Patient then discharged with outpatient follow-up for suture removal 90   Additional history obtained: -Additional history obtained from wife -External records from outside source obtained and reviewed including: Chart review including previous notes,  labs, imaging, consultation notes   Lab Tests: -I ordered, reviewed, and interpreted labs.  The pertinent results include:   Labs Reviewed  BASIC METABOLIC PANEL - Abnormal; Notable for the following components:      Result Value   Creatinine, Ser 1.40 (*)    GFR, Estimated 51 (*)    All other components within normal limits  CBC - Abnormal; Notable for the following components:   WBC 3.2 (*)    All other components within normal limits  URINALYSIS, ROUTINE W REFLEX MICROSCOPIC - Abnormal; Notable for the following components:   APPearance HAZY (*)    All other components within normal limits  CBG MONITORING, ED      EKG   EKG Interpretation  Date/Time:  Saturday March 20 2023 16:47:03 EDT Ventricular Rate:  53 PR Interval:  176 QRS Duration: 82 QT Interval:  448 QTC Calculation: 420 R Axis:   66 Text Interpretation: Sinus bradycardia Possible Anterior infarct , age undetermined Abnormal ECG When compared with ECG of 05-Jan-2023 11:44, PREVIOUS ECG IS PRESENT Confirmed by Kijana Estock ([id number]) on 03/21/2023 1:24:55 AM         Imaging Studies ordered: I ordered imaging studies including CT head I independently visualized and interpreted imaging. I agree with the radiologist interpretation   Medicines ordered and prescription drug management: Meds ordered this encounter  Medications   lactated ringers bolus 1,000 mL   lidocaine-EPINEPHrine (XYLOCAINE W/EPI) 2 %-1:200000 (PF) injection 10 mL    -I have reviewed the patients home medicines and have made adjustments as needed  Critical interventions none     Cardiac Monitoring: The patient was maintained on a cardiac monitor.  I personally viewed and interpreted the cardiac monitored which showed an underlying rhythm of: Sinus bradycardia  Social Determinants of Health:  Factors impacting patients care include: none   Reevaluation: After the interventions noted above, I reevaluated the patient and found  that they have :improved  Co morbidities that complicate the patient evaluation  Past Medical History:  Diagnosis Date   Allergy    Hyperlipidemia    Memory loss    Prediabetes       Dispostion: I considered admission for this patient, but at this time he does not meet inpatient criteria for admission he is safe for discharge with outpatient follow-up     Final Clinical Impression(s) / ED Diagnoses Final diagnoses:  Syncope and collapse  Laceration of finger of right hand without foreign body without damage to nail, unspecified finger, initial encounter     @    Glendora Score, MD 03/21/23 0130

## 2023-03-23 DIAGNOSIS — R55 Syncope and collapse: Secondary | ICD-10-CM | POA: Diagnosis not present

## 2023-03-23 DIAGNOSIS — Z09 Encounter for follow-up examination after completed treatment for conditions other than malignant neoplasm: Secondary | ICD-10-CM | POA: Diagnosis not present

## 2023-03-23 DIAGNOSIS — S61219S Laceration without foreign body of unspecified finger without damage to nail, sequela: Secondary | ICD-10-CM | POA: Diagnosis not present

## 2023-03-23 DIAGNOSIS — R42 Dizziness and giddiness: Secondary | ICD-10-CM | POA: Diagnosis not present

## 2023-03-23 DIAGNOSIS — S0990XD Unspecified injury of head, subsequent encounter: Secondary | ICD-10-CM | POA: Diagnosis not present

## 2023-03-24 DIAGNOSIS — E782 Mixed hyperlipidemia: Secondary | ICD-10-CM | POA: Diagnosis not present

## 2023-03-24 DIAGNOSIS — E039 Hypothyroidism, unspecified: Secondary | ICD-10-CM | POA: Diagnosis not present

## 2023-03-24 DIAGNOSIS — R7303 Prediabetes: Secondary | ICD-10-CM | POA: Diagnosis not present

## 2023-03-24 DIAGNOSIS — R55 Syncope and collapse: Secondary | ICD-10-CM | POA: Diagnosis not present

## 2023-03-30 DIAGNOSIS — E7800 Pure hypercholesterolemia, unspecified: Secondary | ICD-10-CM | POA: Diagnosis not present

## 2023-03-30 DIAGNOSIS — I2510 Atherosclerotic heart disease of native coronary artery without angina pectoris: Secondary | ICD-10-CM | POA: Diagnosis not present

## 2023-03-31 DIAGNOSIS — Z4802 Encounter for removal of sutures: Secondary | ICD-10-CM | POA: Diagnosis not present

## 2023-03-31 DIAGNOSIS — S6990XA Unspecified injury of unspecified wrist, hand and finger(s), initial encounter: Secondary | ICD-10-CM | POA: Diagnosis not present

## 2023-04-15 DIAGNOSIS — C8400 Mycosis fungoides, unspecified site: Secondary | ICD-10-CM | POA: Diagnosis not present

## 2023-04-15 DIAGNOSIS — Z79899 Other long term (current) drug therapy: Secondary | ICD-10-CM | POA: Diagnosis not present

## 2023-04-15 DIAGNOSIS — D709 Neutropenia, unspecified: Secondary | ICD-10-CM | POA: Diagnosis not present

## 2023-04-27 DIAGNOSIS — E039 Hypothyroidism, unspecified: Secondary | ICD-10-CM | POA: Diagnosis not present

## 2023-05-18 DIAGNOSIS — C8400 Mycosis fungoides, unspecified site: Secondary | ICD-10-CM | POA: Diagnosis not present

## 2023-06-04 DIAGNOSIS — E782 Mixed hyperlipidemia: Secondary | ICD-10-CM | POA: Diagnosis not present

## 2023-06-04 DIAGNOSIS — Z87898 Personal history of other specified conditions: Secondary | ICD-10-CM | POA: Diagnosis not present

## 2023-06-04 DIAGNOSIS — E039 Hypothyroidism, unspecified: Secondary | ICD-10-CM | POA: Diagnosis not present

## 2023-06-04 DIAGNOSIS — R7303 Prediabetes: Secondary | ICD-10-CM | POA: Diagnosis not present

## 2023-06-15 DIAGNOSIS — C8400 Mycosis fungoides, unspecified site: Secondary | ICD-10-CM | POA: Diagnosis not present

## 2023-06-17 DIAGNOSIS — C8400 Mycosis fungoides, unspecified site: Secondary | ICD-10-CM | POA: Diagnosis not present

## 2023-06-17 DIAGNOSIS — Z79899 Other long term (current) drug therapy: Secondary | ICD-10-CM | POA: Diagnosis not present

## 2023-06-22 DIAGNOSIS — C8400 Mycosis fungoides, unspecified site: Secondary | ICD-10-CM | POA: Diagnosis not present

## 2023-06-24 DIAGNOSIS — C8400 Mycosis fungoides, unspecified site: Secondary | ICD-10-CM | POA: Diagnosis not present

## 2023-06-29 DIAGNOSIS — C8400 Mycosis fungoides, unspecified site: Secondary | ICD-10-CM | POA: Diagnosis not present

## 2023-07-01 DIAGNOSIS — C8400 Mycosis fungoides, unspecified site: Secondary | ICD-10-CM | POA: Diagnosis not present

## 2023-07-06 DIAGNOSIS — C8400 Mycosis fungoides, unspecified site: Secondary | ICD-10-CM | POA: Diagnosis not present

## 2023-07-13 DIAGNOSIS — C8400 Mycosis fungoides, unspecified site: Secondary | ICD-10-CM | POA: Diagnosis not present

## 2023-07-15 ENCOUNTER — Institutional Professional Consult (permissible substitution): Payer: Medicare HMO | Admitting: Neurology

## 2023-07-15 DIAGNOSIS — C8400 Mycosis fungoides, unspecified site: Secondary | ICD-10-CM | POA: Diagnosis not present

## 2023-07-20 DIAGNOSIS — C8400 Mycosis fungoides, unspecified site: Secondary | ICD-10-CM | POA: Diagnosis not present

## 2023-07-22 DIAGNOSIS — C8400 Mycosis fungoides, unspecified site: Secondary | ICD-10-CM | POA: Diagnosis not present

## 2023-07-27 DIAGNOSIS — C8400 Mycosis fungoides, unspecified site: Secondary | ICD-10-CM | POA: Diagnosis not present

## 2023-07-28 ENCOUNTER — Other Ambulatory Visit: Payer: Self-pay | Admitting: Nurse Practitioner

## 2023-07-28 DIAGNOSIS — E041 Nontoxic single thyroid nodule: Secondary | ICD-10-CM

## 2023-07-29 DIAGNOSIS — C8400 Mycosis fungoides, unspecified site: Secondary | ICD-10-CM | POA: Diagnosis not present

## 2023-08-03 DIAGNOSIS — C8400 Mycosis fungoides, unspecified site: Secondary | ICD-10-CM | POA: Diagnosis not present

## 2023-08-05 DIAGNOSIS — C8400 Mycosis fungoides, unspecified site: Secondary | ICD-10-CM | POA: Diagnosis not present

## 2023-08-10 DIAGNOSIS — C8400 Mycosis fungoides, unspecified site: Secondary | ICD-10-CM | POA: Diagnosis not present

## 2023-08-12 DIAGNOSIS — C8400 Mycosis fungoides, unspecified site: Secondary | ICD-10-CM | POA: Diagnosis not present

## 2023-08-17 DIAGNOSIS — C8400 Mycosis fungoides, unspecified site: Secondary | ICD-10-CM | POA: Diagnosis not present

## 2023-08-19 DIAGNOSIS — C8400 Mycosis fungoides, unspecified site: Secondary | ICD-10-CM | POA: Diagnosis not present

## 2023-08-24 DIAGNOSIS — C8400 Mycosis fungoides, unspecified site: Secondary | ICD-10-CM | POA: Diagnosis not present

## 2023-08-26 DIAGNOSIS — C8400 Mycosis fungoides, unspecified site: Secondary | ICD-10-CM | POA: Diagnosis not present

## 2023-08-31 DIAGNOSIS — C8400 Mycosis fungoides, unspecified site: Secondary | ICD-10-CM | POA: Diagnosis not present

## 2023-09-02 DIAGNOSIS — C8400 Mycosis fungoides, unspecified site: Secondary | ICD-10-CM | POA: Diagnosis not present

## 2023-09-06 DIAGNOSIS — E039 Hypothyroidism, unspecified: Secondary | ICD-10-CM | POA: Diagnosis not present

## 2023-09-06 DIAGNOSIS — R7303 Prediabetes: Secondary | ICD-10-CM | POA: Diagnosis not present

## 2023-09-06 DIAGNOSIS — E782 Mixed hyperlipidemia: Secondary | ICD-10-CM | POA: Diagnosis not present

## 2023-09-07 DIAGNOSIS — C8400 Mycosis fungoides, unspecified site: Secondary | ICD-10-CM | POA: Diagnosis not present

## 2023-09-09 DIAGNOSIS — C8400 Mycosis fungoides, unspecified site: Secondary | ICD-10-CM | POA: Diagnosis not present

## 2023-09-14 DIAGNOSIS — C8400 Mycosis fungoides, unspecified site: Secondary | ICD-10-CM | POA: Diagnosis not present

## 2023-09-16 DIAGNOSIS — C8400 Mycosis fungoides, unspecified site: Secondary | ICD-10-CM | POA: Diagnosis not present

## 2023-09-16 DIAGNOSIS — Z79899 Other long term (current) drug therapy: Secondary | ICD-10-CM | POA: Diagnosis not present

## 2023-09-21 DIAGNOSIS — C8400 Mycosis fungoides, unspecified site: Secondary | ICD-10-CM | POA: Diagnosis not present

## 2023-09-21 DIAGNOSIS — Z23 Encounter for immunization: Secondary | ICD-10-CM | POA: Diagnosis not present

## 2023-09-23 DIAGNOSIS — C8400 Mycosis fungoides, unspecified site: Secondary | ICD-10-CM | POA: Diagnosis not present

## 2023-09-28 DIAGNOSIS — C8400 Mycosis fungoides, unspecified site: Secondary | ICD-10-CM | POA: Diagnosis not present

## 2023-09-30 DIAGNOSIS — C8400 Mycosis fungoides, unspecified site: Secondary | ICD-10-CM | POA: Diagnosis not present

## 2023-10-05 DIAGNOSIS — C8400 Mycosis fungoides, unspecified site: Secondary | ICD-10-CM | POA: Diagnosis not present

## 2023-10-07 DIAGNOSIS — C8400 Mycosis fungoides, unspecified site: Secondary | ICD-10-CM | POA: Diagnosis not present

## 2023-10-12 DIAGNOSIS — C8400 Mycosis fungoides, unspecified site: Secondary | ICD-10-CM | POA: Diagnosis not present

## 2023-10-14 DIAGNOSIS — C8400 Mycosis fungoides, unspecified site: Secondary | ICD-10-CM | POA: Diagnosis not present

## 2023-10-18 ENCOUNTER — Ambulatory Visit: Payer: Medicare HMO | Admitting: Neurology

## 2023-10-18 ENCOUNTER — Encounter: Payer: Self-pay | Admitting: Neurology

## 2023-10-18 VITALS — Ht 73.0 in | Wt 193.0 lb

## 2023-10-18 DIAGNOSIS — R55 Syncope and collapse: Secondary | ICD-10-CM | POA: Diagnosis not present

## 2023-10-18 DIAGNOSIS — R4189 Other symptoms and signs involving cognitive functions and awareness: Secondary | ICD-10-CM | POA: Diagnosis not present

## 2023-10-18 NOTE — Progress Notes (Signed)
Chief Complaint  Patient presents with   Dizziness    Rm 15. Patient with wife, and reports and having two episodes of passing out when bending over. One in the ER and one at home from bending over he thinks      ASSESSMENT AND PLAN  Eric Tyler is a 77 y.o. male   Cognitive impairment  MoCA examination 13/30 today, most likely central nervous system degenerative disorder  MRI of the brain from February 2024 showed no acute abnormality, mild to moderate small vessel disease  Laboratory evaluation showed low normal B12 250, suggest over-the-counter B12 supplement, rest of the laboratory evaluation showed no treatable etiology Passing out episode  Semiology most suggestive of syncope,  EEG   DIAGNOSTIC DATA (LABS, IMAGING, TESTING) - I reviewed patient records, labs, notes, testing and imaging myself where available.   MEDICAL HISTORY:  Eric Tyler, is a 77 year old male, accompanied by his wife seen in request by his primary care doctor Merri Brunette, for evaluation of passing out, initial evaluation was on November 18  History is obtained from the patient and review of electronic medical records. I personally reviewed pertinent available imaging films in PACS.   PMHx of  mycosis fungoides, on Targretin   Retired from Set designer job, lives at home with his wife, noted to have gradual onset of memory loss over the past few years, gradually getting worse, today's MoCA examination is only 13/30  He had 2 episode of passing out,  First episode was in February 2024, he complained to his wife at her doctor's visit, while in the sitting area, he felt to coming over his body, body shaking as if he got a chill, he denied total loss of consciousness, wife reported that he was responsive, evaluation demonstrate infiltration of bilateral lower lung, he was treated as community-acquired pneumonia MRI of the brain without contrast demonstrated no acute abnormality age-related  volume loss, mild small vessel disease  CT Angiogram of the chest showed no evidence of pulmonary emboli,  Another episode of March 20, 2023, 1 himself presented to the emergency room for laceration of his right flank and third fingers doing yard work, he was sitting on the exam table soaking right hand, was alone in the exam room, reported he felt hot, overheated, and then passed out,  CT head without contrast showed no acute abnormality  Laboratory evaluation showed normal TSH, B12, 250,   PHYSICAL EXAM:   Vitals:   10/18/23 1027  Weight: 193 lb (87.5 kg)  Height: 6\' 1"  (1.854 m)    Body mass index is 25.46 kg/m.  PHYSICAL EXAMNIATION:  Gen: NAD, conversant, well nourised, well groomed                     Cardiovascular: Regular rate rhythm, no peripheral edema, warm, nontender. Eyes: Conjunctivae clear without exudates or hemorrhage Neck: Supple, no carotid bruits. Pulmonary: Clear to auscultation bilaterally   NEUROLOGICAL EXAM:  MENTAL STATUS: Speech/cognition: Awake, alert, oriented to history taking and casual conversation    10/18/2023   10:00 AM  Montreal Cognitive Assessment Blind  Attention: Read list of digits (0/2) 1  Attention: Read list of letters (0/1) 1  Attention: Serial 7 subtraction starting at 100 (0/3) 1  Language: Repeat phrase (0/2) 2  Language : Fluency (0/1) 0  Abstraction (0/2) 2  Delayed Recall (0/5) 0  Orientation (0/6) 6  Total 13    CRANIAL NERVES: CN II: Visual fields are full to  confrontation. Pupils are round equal and briskly reactive to light. CN III, IV, VI: extraocular movement are normal. No ptosis. CN V: Facial sensation is intact to light touch CN VII: Face is symmetric with normal eye closure  CN VIII: Hard of hearing CN IX, X: Phonation is normal. CN XI: Head turning and shoulder shrug are intact  MOTOR: There is no pronator drift of out-stretched arms. Muscle bulk and tone are normal. Muscle strength is  normal.  REFLEXES: Reflexes are 2+ and symmetric at the biceps, triceps, knees, and ankles. Plantar responses are flexor.  SENSORY: Intact to light touch, pinprick and vibratory sensation are intact in fingers and toes.  COORDINATION: There is no trunk or limb dysmetria noted.  GAIT/STANCE: Posture is normal. Gait is steady    REVIEW OF SYSTEMS:  Full 14 system review of systems performed and notable only for as above All other review of systems were negative.   ALLERGIES: No Known Allergies  HOME MEDICATIONS: Current Outpatient Medications  Medication Sig Dispense Refill   Acetaminophen (TYLENOL 8 HOUR ARTHRITIS PAIN PO) Take 1 tablet by mouth 3 (three) times daily as needed (for pain).     amoxicillin-clavulanate (AUGMENTIN) 875-125 MG tablet Take 1 tablet by mouth 2 (two) times daily. 9 tablet 0   aspirin EC 81 MG tablet Take 81 mg by mouth daily.     azithromycin (ZITHROMAX Z-PAK) 250 MG tablet Take 1 tablet (250 mg total) by mouth daily. 4 each 0   bexarotene (TARGRETIN) 75 MG CAPS capsule Take 150 mg by mouth daily.     cetirizine (ZYRTEC) 10 MG tablet Take 1 tablet (10 mg total) by mouth daily for 10 days. (Patient taking differently: Take 10 mg by mouth daily as needed for allergies.) 10 tablet 3   cholecalciferol (VITAMIN D3) 25 MCG (1000 UNIT) tablet Take 2,000 Units by mouth daily.     clobetasol ointment (TEMOVATE) 0.05 % Apply 1 Application topically daily as needed (for skin cancer to keep skin moisturized).     donepezil (ARICEPT) 10 MG tablet TAKE 1 TABLET BY MOUTH EVERYDAY AT BEDTIME (Patient taking differently: Take 10 mg by mouth at bedtime.) 30 tablet 2   fenofibrate (TRICOR) 48 MG tablet Take 48 mg by mouth daily.     Ferrous Sulfate (IRON PO) Take 1 tablet by mouth daily.     folic acid (FOLVITE) 1 MG tablet Take 1 tablet by mouth daily.     furosemide (LASIX) 20 MG tablet Take 20 mg by mouth daily.     hydrOXYzine (ATARAX) 10 MG tablet Take 10 mg by mouth  at bedtime as needed for itching.     levothyroxine (SYNTHROID) 50 MCG tablet Take 50 mcg by mouth daily before breakfast.     Multiple Vitamin (MULTIVITAMIN WITH MINERALS) TABS tablet Take 1 tablet by mouth daily.     omega-3 acid ethyl esters (LOVAZA) 1 G capsule Take 1 g by mouth 2 (two) times daily.     ondansetron (ZOFRAN-ODT) 8 MG disintegrating tablet Take 1 tablet (8 mg total) by mouth every 8 (eight) hours as needed for nausea. 20 tablet 0   rosuvastatin (CRESTOR) 10 MG tablet Take 10 mg by mouth at bedtime.     triamcinolone cream (KENALOG) 0.1 % Apply 1 Application topically 2 (two) times daily.     No current facility-administered medications for this visit.    PAST MEDICAL HISTORY: Past Medical History:  Diagnosis Date   Allergy    Hyperlipidemia  Memory loss    Prediabetes     PAST SURGICAL HISTORY: Past Surgical History:  Procedure Laterality Date   FINGER SURGERY     Right thumb   NM MYOVIEW LTD  08/2018   Myoview: EF 48% with global HK.  Mild reversibility involving the mid anteroseptal wall suspicious for ischemia.  Intermediate risk.    FAMILY HISTORY: Family History  Problem Relation Age of Onset   Cancer Mother    Hypertension Mother    Hypertension Father    Hyperlipidemia Father    Hypertension Son     SOCIAL HISTORY: Social History   Socioeconomic History   Marital status: Married    Spouse name: [name]   Number of children: 8   Years of education: 12   Highest education level: Not on file  Occupational History    Comment: retired  Tobacco Use   Smoking status: Never   Smokeless tobacco: Never  Vaping Use   Vaping status: Never Used  Substance and Sexual Activity   Alcohol use: No    Alcohol/week: 0.0 standard drinks of alcohol   Drug use: No   Sexual activity: Not on file  Other Topics Concern   Not on file  Social History Narrative   Lives with wife.  He has a living will with attorney Isabell Jarvis).      He moved to [place] from [place] in 2019.  He has been seen by Dr. 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   Social Determinants of Health   Financial Resource Strain: Not on file  Food Insecurity: No Food Insecurity (01/06/2023)   Hunger Vital Sign    Worried About Running Out of Food in the Last Year: Never true    Ran Out of Food in the Last Year: Never true  Transportation Needs: No Transportation Needs (01/06/2023)   PRAPARE - Administrator, Civil Service (Medical): No    Lack of Transportation (Non-Medical): No  Physical Activity: Not on file  Stress: Not on file  Social Connections: Not on file  Intimate Partner Violence: Not At Risk (01/06/2023)   Humiliation, Afraid, Rape, and Kick questionnaire    Fear of Current or Ex-Partner: No    Emotionally Abused: No    Physically Abused: No    Sexually Abused: No      Levert Feinstein, M.D. Ph.D.  [facility] [address] [place], [place] [address] Ph: [phone] Fax: [phone]  CC:  Fatima Sanger, FNP [address] [place],  [place] [address]  Merri Brunette, MD

## 2023-10-19 DIAGNOSIS — C8400 Mycosis fungoides, unspecified site: Secondary | ICD-10-CM | POA: Diagnosis not present

## 2023-10-21 DIAGNOSIS — D72819 Decreased white blood cell count, unspecified: Secondary | ICD-10-CM | POA: Diagnosis not present

## 2023-10-21 DIAGNOSIS — Z125 Encounter for screening for malignant neoplasm of prostate: Secondary | ICD-10-CM | POA: Diagnosis not present

## 2023-10-21 DIAGNOSIS — C8400 Mycosis fungoides, unspecified site: Secondary | ICD-10-CM | POA: Diagnosis not present

## 2023-10-21 DIAGNOSIS — R7303 Prediabetes: Secondary | ICD-10-CM | POA: Diagnosis not present

## 2023-10-25 ENCOUNTER — Encounter: Payer: Self-pay | Admitting: *Deleted

## 2023-10-25 ENCOUNTER — Other Ambulatory Visit: Payer: Medicare HMO | Admitting: *Deleted

## 2023-10-25 DIAGNOSIS — R7303 Prediabetes: Secondary | ICD-10-CM | POA: Diagnosis not present

## 2023-10-25 DIAGNOSIS — I2510 Atherosclerotic heart disease of native coronary artery without angina pectoris: Secondary | ICD-10-CM | POA: Diagnosis not present

## 2023-10-25 DIAGNOSIS — Z0000 Encounter for general adult medical examination without abnormal findings: Secondary | ICD-10-CM | POA: Diagnosis not present

## 2023-10-25 DIAGNOSIS — G3184 Mild cognitive impairment, so stated: Secondary | ICD-10-CM | POA: Diagnosis not present

## 2023-10-25 DIAGNOSIS — E039 Hypothyroidism, unspecified: Secondary | ICD-10-CM | POA: Diagnosis not present

## 2023-10-25 DIAGNOSIS — N529 Male erectile dysfunction, unspecified: Secondary | ICD-10-CM | POA: Diagnosis not present

## 2023-10-25 DIAGNOSIS — C8400 Mycosis fungoides, unspecified site: Secondary | ICD-10-CM | POA: Diagnosis not present

## 2023-10-25 DIAGNOSIS — I700 Atherosclerosis of aorta: Secondary | ICD-10-CM | POA: Diagnosis not present

## 2023-10-26 DIAGNOSIS — C8400 Mycosis fungoides, unspecified site: Secondary | ICD-10-CM | POA: Diagnosis not present

## 2023-11-02 DIAGNOSIS — C8400 Mycosis fungoides, unspecified site: Secondary | ICD-10-CM | POA: Diagnosis not present

## 2023-11-04 DIAGNOSIS — C8400 Mycosis fungoides, unspecified site: Secondary | ICD-10-CM | POA: Diagnosis not present

## 2023-11-09 DIAGNOSIS — C8400 Mycosis fungoides, unspecified site: Secondary | ICD-10-CM | POA: Diagnosis not present

## 2023-11-16 DIAGNOSIS — C8400 Mycosis fungoides, unspecified site: Secondary | ICD-10-CM | POA: Diagnosis not present

## 2023-11-16 DIAGNOSIS — Z79899 Other long term (current) drug therapy: Secondary | ICD-10-CM | POA: Diagnosis not present

## 2023-11-18 DIAGNOSIS — C8400 Mycosis fungoides, unspecified site: Secondary | ICD-10-CM | POA: Diagnosis not present

## 2023-11-25 DIAGNOSIS — C8400 Mycosis fungoides, unspecified site: Secondary | ICD-10-CM | POA: Diagnosis not present

## 2023-11-30 DIAGNOSIS — C8400 Mycosis fungoides, unspecified site: Secondary | ICD-10-CM | POA: Diagnosis not present

## 2023-12-09 DIAGNOSIS — C8400 Mycosis fungoides, unspecified site: Secondary | ICD-10-CM | POA: Diagnosis not present

## 2023-12-14 DIAGNOSIS — C8400 Mycosis fungoides, unspecified site: Secondary | ICD-10-CM | POA: Diagnosis not present

## 2023-12-16 DIAGNOSIS — C8400 Mycosis fungoides, unspecified site: Secondary | ICD-10-CM | POA: Diagnosis not present

## 2023-12-21 DIAGNOSIS — C8400 Mycosis fungoides, unspecified site: Secondary | ICD-10-CM | POA: Diagnosis not present

## 2023-12-23 DIAGNOSIS — R55 Syncope and collapse: Secondary | ICD-10-CM | POA: Diagnosis not present

## 2023-12-23 DIAGNOSIS — I493 Ventricular premature depolarization: Secondary | ICD-10-CM | POA: Diagnosis not present

## 2023-12-28 DIAGNOSIS — I493 Ventricular premature depolarization: Secondary | ICD-10-CM | POA: Diagnosis not present

## 2023-12-28 DIAGNOSIS — C8400 Mycosis fungoides, unspecified site: Secondary | ICD-10-CM | POA: Diagnosis not present

## 2023-12-30 DIAGNOSIS — C8400 Mycosis fungoides, unspecified site: Secondary | ICD-10-CM | POA: Diagnosis not present

## 2024-01-04 DIAGNOSIS — C8400 Mycosis fungoides, unspecified site: Secondary | ICD-10-CM | POA: Diagnosis not present

## 2024-01-06 DIAGNOSIS — C8400 Mycosis fungoides, unspecified site: Secondary | ICD-10-CM | POA: Diagnosis not present

## 2024-01-18 DIAGNOSIS — C8400 Mycosis fungoides, unspecified site: Secondary | ICD-10-CM | POA: Diagnosis not present

## 2024-01-25 DIAGNOSIS — C8400 Mycosis fungoides, unspecified site: Secondary | ICD-10-CM | POA: Diagnosis not present

## 2024-01-27 DIAGNOSIS — Z79899 Other long term (current) drug therapy: Secondary | ICD-10-CM | POA: Diagnosis not present

## 2024-01-27 DIAGNOSIS — C8400 Mycosis fungoides, unspecified site: Secondary | ICD-10-CM | POA: Diagnosis not present

## 2024-01-31 DIAGNOSIS — E782 Mixed hyperlipidemia: Secondary | ICD-10-CM | POA: Diagnosis not present

## 2024-01-31 DIAGNOSIS — Z87898 Personal history of other specified conditions: Secondary | ICD-10-CM | POA: Diagnosis not present

## 2024-01-31 DIAGNOSIS — I10 Essential (primary) hypertension: Secondary | ICD-10-CM | POA: Diagnosis not present

## 2024-01-31 DIAGNOSIS — E039 Hypothyroidism, unspecified: Secondary | ICD-10-CM | POA: Diagnosis not present

## 2024-02-01 DIAGNOSIS — C8400 Mycosis fungoides, unspecified site: Secondary | ICD-10-CM | POA: Diagnosis not present

## 2024-02-03 DIAGNOSIS — C8400 Mycosis fungoides, unspecified site: Secondary | ICD-10-CM | POA: Diagnosis not present

## 2024-02-08 DIAGNOSIS — C8400 Mycosis fungoides, unspecified site: Secondary | ICD-10-CM | POA: Diagnosis not present

## 2024-02-10 DIAGNOSIS — C8408 Mycosis fungoides, lymph nodes of multiple sites: Secondary | ICD-10-CM | POA: Diagnosis not present

## 2024-02-10 DIAGNOSIS — Z510 Encounter for antineoplastic radiation therapy: Secondary | ICD-10-CM | POA: Diagnosis not present

## 2024-02-10 DIAGNOSIS — C8409 Mycosis fungoides, extranodal and solid organ sites: Secondary | ICD-10-CM | POA: Diagnosis not present

## 2024-02-10 DIAGNOSIS — C8400 Mycosis fungoides, unspecified site: Secondary | ICD-10-CM | POA: Diagnosis not present

## 2024-02-11 DIAGNOSIS — C8409 Mycosis fungoides, extranodal and solid organ sites: Secondary | ICD-10-CM | POA: Diagnosis not present

## 2024-02-11 DIAGNOSIS — C8408 Mycosis fungoides, lymph nodes of multiple sites: Secondary | ICD-10-CM | POA: Diagnosis not present

## 2024-02-15 DIAGNOSIS — C8400 Mycosis fungoides, unspecified site: Secondary | ICD-10-CM | POA: Diagnosis not present

## 2024-02-17 DIAGNOSIS — C8400 Mycosis fungoides, unspecified site: Secondary | ICD-10-CM | POA: Diagnosis not present

## 2024-02-23 DIAGNOSIS — Z510 Encounter for antineoplastic radiation therapy: Secondary | ICD-10-CM | POA: Diagnosis not present

## 2024-02-23 DIAGNOSIS — C8409 Mycosis fungoides, extranodal and solid organ sites: Secondary | ICD-10-CM | POA: Diagnosis not present

## 2024-02-23 DIAGNOSIS — C8408 Mycosis fungoides, lymph nodes of multiple sites: Secondary | ICD-10-CM | POA: Diagnosis not present

## 2024-02-29 DIAGNOSIS — C8400 Mycosis fungoides, unspecified site: Secondary | ICD-10-CM | POA: Diagnosis not present

## 2024-03-02 DIAGNOSIS — C8400 Mycosis fungoides, unspecified site: Secondary | ICD-10-CM | POA: Diagnosis not present

## 2024-03-07 DIAGNOSIS — C8400 Mycosis fungoides, unspecified site: Secondary | ICD-10-CM | POA: Diagnosis not present

## 2024-03-08 DIAGNOSIS — B9689 Other specified bacterial agents as the cause of diseases classified elsewhere: Secondary | ICD-10-CM | POA: Diagnosis not present

## 2024-03-08 DIAGNOSIS — J208 Acute bronchitis due to other specified organisms: Secondary | ICD-10-CM | POA: Diagnosis not present

## 2024-03-09 DIAGNOSIS — C8400 Mycosis fungoides, unspecified site: Secondary | ICD-10-CM | POA: Diagnosis not present

## 2024-03-14 DIAGNOSIS — C8400 Mycosis fungoides, unspecified site: Secondary | ICD-10-CM | POA: Diagnosis not present

## 2024-03-16 DIAGNOSIS — C8400 Mycosis fungoides, unspecified site: Secondary | ICD-10-CM | POA: Diagnosis not present

## 2024-03-21 DIAGNOSIS — C8400 Mycosis fungoides, unspecified site: Secondary | ICD-10-CM | POA: Diagnosis not present

## 2024-03-23 DIAGNOSIS — Z79899 Other long term (current) drug therapy: Secondary | ICD-10-CM | POA: Diagnosis not present

## 2024-03-23 DIAGNOSIS — C8400 Mycosis fungoides, unspecified site: Secondary | ICD-10-CM | POA: Diagnosis not present

## 2024-03-28 DIAGNOSIS — C8400 Mycosis fungoides, unspecified site: Secondary | ICD-10-CM | POA: Diagnosis not present

## 2024-03-30 DIAGNOSIS — C8400 Mycosis fungoides, unspecified site: Secondary | ICD-10-CM | POA: Diagnosis not present

## 2024-04-04 DIAGNOSIS — C8400 Mycosis fungoides, unspecified site: Secondary | ICD-10-CM | POA: Diagnosis not present

## 2024-04-06 DIAGNOSIS — C8400 Mycosis fungoides, unspecified site: Secondary | ICD-10-CM | POA: Diagnosis not present

## 2024-04-11 DIAGNOSIS — C8400 Mycosis fungoides, unspecified site: Secondary | ICD-10-CM | POA: Diagnosis not present

## 2024-04-13 DIAGNOSIS — C8400 Mycosis fungoides, unspecified site: Secondary | ICD-10-CM | POA: Diagnosis not present

## 2024-04-18 DIAGNOSIS — C8400 Mycosis fungoides, unspecified site: Secondary | ICD-10-CM | POA: Diagnosis not present

## 2024-04-20 DIAGNOSIS — C8400 Mycosis fungoides, unspecified site: Secondary | ICD-10-CM | POA: Diagnosis not present

## 2024-04-27 DIAGNOSIS — C8400 Mycosis fungoides, unspecified site: Secondary | ICD-10-CM | POA: Diagnosis not present

## 2024-05-02 DIAGNOSIS — C8400 Mycosis fungoides, unspecified site: Secondary | ICD-10-CM | POA: Diagnosis not present

## 2024-05-04 DIAGNOSIS — C8400 Mycosis fungoides, unspecified site: Secondary | ICD-10-CM | POA: Diagnosis not present

## 2024-05-09 DIAGNOSIS — C8400 Mycosis fungoides, unspecified site: Secondary | ICD-10-CM | POA: Diagnosis not present

## 2024-05-11 DIAGNOSIS — C8400 Mycosis fungoides, unspecified site: Secondary | ICD-10-CM | POA: Diagnosis not present

## 2024-05-16 DIAGNOSIS — C8400 Mycosis fungoides, unspecified site: Secondary | ICD-10-CM | POA: Diagnosis not present

## 2024-05-18 DIAGNOSIS — C8400 Mycosis fungoides, unspecified site: Secondary | ICD-10-CM | POA: Diagnosis not present

## 2024-05-23 DIAGNOSIS — C8400 Mycosis fungoides, unspecified site: Secondary | ICD-10-CM | POA: Diagnosis not present

## 2024-05-25 DIAGNOSIS — I10 Essential (primary) hypertension: Secondary | ICD-10-CM | POA: Diagnosis not present

## 2024-05-25 DIAGNOSIS — F0390 Unspecified dementia without behavioral disturbance: Secondary | ICD-10-CM | POA: Diagnosis not present

## 2024-05-25 DIAGNOSIS — C8400 Mycosis fungoides, unspecified site: Secondary | ICD-10-CM | POA: Diagnosis not present

## 2024-05-25 DIAGNOSIS — E785 Hyperlipidemia, unspecified: Secondary | ICD-10-CM | POA: Diagnosis not present

## 2024-05-25 DIAGNOSIS — E039 Hypothyroidism, unspecified: Secondary | ICD-10-CM | POA: Diagnosis not present

## 2024-05-30 DIAGNOSIS — C8400 Mycosis fungoides, unspecified site: Secondary | ICD-10-CM | POA: Diagnosis not present

## 2024-06-06 DIAGNOSIS — C8400 Mycosis fungoides, unspecified site: Secondary | ICD-10-CM | POA: Diagnosis not present

## 2024-06-08 DIAGNOSIS — C8400 Mycosis fungoides, unspecified site: Secondary | ICD-10-CM | POA: Diagnosis not present

## 2024-06-13 DIAGNOSIS — C8400 Mycosis fungoides, unspecified site: Secondary | ICD-10-CM | POA: Diagnosis not present

## 2024-06-20 DIAGNOSIS — C8400 Mycosis fungoides, unspecified site: Secondary | ICD-10-CM | POA: Diagnosis not present

## 2024-06-22 DIAGNOSIS — C8400 Mycosis fungoides, unspecified site: Secondary | ICD-10-CM | POA: Diagnosis not present

## 2024-06-22 DIAGNOSIS — Z79899 Other long term (current) drug therapy: Secondary | ICD-10-CM | POA: Diagnosis not present

## 2024-06-27 DIAGNOSIS — C8400 Mycosis fungoides, unspecified site: Secondary | ICD-10-CM | POA: Diagnosis not present

## 2024-06-29 DIAGNOSIS — C8400 Mycosis fungoides, unspecified site: Secondary | ICD-10-CM | POA: Diagnosis not present

## 2024-07-03 DIAGNOSIS — H6123 Impacted cerumen, bilateral: Secondary | ICD-10-CM | POA: Diagnosis not present

## 2024-07-04 DIAGNOSIS — C8400 Mycosis fungoides, unspecified site: Secondary | ICD-10-CM | POA: Diagnosis not present

## 2024-07-06 DIAGNOSIS — C8400 Mycosis fungoides, unspecified site: Secondary | ICD-10-CM | POA: Diagnosis not present

## 2024-07-11 DIAGNOSIS — Z5181 Encounter for therapeutic drug level monitoring: Secondary | ICD-10-CM | POA: Diagnosis not present

## 2024-07-11 DIAGNOSIS — C8400 Mycosis fungoides, unspecified site: Secondary | ICD-10-CM | POA: Diagnosis not present

## 2024-07-13 DIAGNOSIS — C8400 Mycosis fungoides, unspecified site: Secondary | ICD-10-CM | POA: Diagnosis not present

## 2024-07-18 DIAGNOSIS — C8400 Mycosis fungoides, unspecified site: Secondary | ICD-10-CM | POA: Diagnosis not present

## 2024-07-20 DIAGNOSIS — C8400 Mycosis fungoides, unspecified site: Secondary | ICD-10-CM | POA: Diagnosis not present

## 2024-07-20 DIAGNOSIS — R3912 Poor urinary stream: Secondary | ICD-10-CM | POA: Diagnosis not present

## 2024-07-20 DIAGNOSIS — N401 Enlarged prostate with lower urinary tract symptoms: Secondary | ICD-10-CM | POA: Diagnosis not present

## 2024-07-25 DIAGNOSIS — C8400 Mycosis fungoides, unspecified site: Secondary | ICD-10-CM | POA: Diagnosis not present

## 2024-07-27 DIAGNOSIS — C8400 Mycosis fungoides, unspecified site: Secondary | ICD-10-CM | POA: Diagnosis not present

## 2024-08-01 DIAGNOSIS — C8400 Mycosis fungoides, unspecified site: Secondary | ICD-10-CM | POA: Diagnosis not present

## 2024-08-03 DIAGNOSIS — C8400 Mycosis fungoides, unspecified site: Secondary | ICD-10-CM | POA: Diagnosis not present

## 2024-08-08 DIAGNOSIS — C8400 Mycosis fungoides, unspecified site: Secondary | ICD-10-CM | POA: Diagnosis not present

## 2024-08-10 DIAGNOSIS — C8400 Mycosis fungoides, unspecified site: Secondary | ICD-10-CM | POA: Diagnosis not present

## 2024-08-15 DIAGNOSIS — C8400 Mycosis fungoides, unspecified site: Secondary | ICD-10-CM | POA: Diagnosis not present

## 2024-08-17 DIAGNOSIS — C8400 Mycosis fungoides, unspecified site: Secondary | ICD-10-CM | POA: Diagnosis not present

## 2024-08-18 DIAGNOSIS — Z23 Encounter for immunization: Secondary | ICD-10-CM | POA: Diagnosis not present

## 2024-08-22 DIAGNOSIS — C8400 Mycosis fungoides, unspecified site: Secondary | ICD-10-CM | POA: Diagnosis not present

## 2024-08-24 DIAGNOSIS — C8400 Mycosis fungoides, unspecified site: Secondary | ICD-10-CM | POA: Diagnosis not present

## 2024-08-29 DIAGNOSIS — F0390 Unspecified dementia without behavioral disturbance: Secondary | ICD-10-CM | POA: Diagnosis not present

## 2024-08-29 DIAGNOSIS — E782 Mixed hyperlipidemia: Secondary | ICD-10-CM | POA: Diagnosis not present

## 2024-08-29 DIAGNOSIS — I10 Essential (primary) hypertension: Secondary | ICD-10-CM | POA: Diagnosis not present

## 2024-08-29 DIAGNOSIS — E039 Hypothyroidism, unspecified: Secondary | ICD-10-CM | POA: Diagnosis not present

## 2024-08-31 DIAGNOSIS — C8400 Mycosis fungoides, unspecified site: Secondary | ICD-10-CM | POA: Diagnosis not present

## 2024-09-05 DIAGNOSIS — C8400 Mycosis fungoides, unspecified site: Secondary | ICD-10-CM | POA: Diagnosis not present

## 2024-09-07 DIAGNOSIS — C8400 Mycosis fungoides, unspecified site: Secondary | ICD-10-CM | POA: Diagnosis not present

## 2024-09-12 DIAGNOSIS — C8400 Mycosis fungoides, unspecified site: Secondary | ICD-10-CM | POA: Diagnosis not present

## 2024-09-19 DIAGNOSIS — C8400 Mycosis fungoides, unspecified site: Secondary | ICD-10-CM | POA: Diagnosis not present

## 2024-09-21 DIAGNOSIS — C8400 Mycosis fungoides, unspecified site: Secondary | ICD-10-CM | POA: Diagnosis not present

## 2024-09-26 DIAGNOSIS — C8400 Mycosis fungoides, unspecified site: Secondary | ICD-10-CM | POA: Diagnosis not present

## 2024-09-28 DIAGNOSIS — C8400 Mycosis fungoides, unspecified site: Secondary | ICD-10-CM | POA: Diagnosis not present

## 2024-09-28 DIAGNOSIS — Z79899 Other long term (current) drug therapy: Secondary | ICD-10-CM | POA: Diagnosis not present

## 2024-10-03 DIAGNOSIS — C8400 Mycosis fungoides, unspecified site: Secondary | ICD-10-CM | POA: Diagnosis not present

## 2024-10-05 DIAGNOSIS — C8400 Mycosis fungoides, unspecified site: Secondary | ICD-10-CM | POA: Diagnosis not present

## 2024-10-10 DIAGNOSIS — C8400 Mycosis fungoides, unspecified site: Secondary | ICD-10-CM | POA: Diagnosis not present

## 2024-10-12 DIAGNOSIS — C8400 Mycosis fungoides, unspecified site: Secondary | ICD-10-CM | POA: Diagnosis not present

## 2024-10-17 DIAGNOSIS — C8400 Mycosis fungoides, unspecified site: Secondary | ICD-10-CM | POA: Diagnosis not present

## 2024-10-19 DIAGNOSIS — C8400 Mycosis fungoides, unspecified site: Secondary | ICD-10-CM | POA: Diagnosis not present

## 2024-10-23 DIAGNOSIS — Z79899 Other long term (current) drug therapy: Secondary | ICD-10-CM | POA: Diagnosis not present

## 2024-10-23 DIAGNOSIS — C8409 Mycosis fungoides, extranodal and solid organ sites: Secondary | ICD-10-CM | POA: Diagnosis not present

## 2024-10-23 DIAGNOSIS — C8400 Mycosis fungoides, unspecified site: Secondary | ICD-10-CM | POA: Diagnosis not present

## 2024-10-30 ENCOUNTER — Other Ambulatory Visit: Payer: Self-pay | Admitting: Internal Medicine

## 2024-10-30 DIAGNOSIS — E041 Nontoxic single thyroid nodule: Secondary | ICD-10-CM

## 2024-10-30 DIAGNOSIS — F03B0 Unspecified dementia, moderate, without behavioral disturbance, psychotic disturbance, mood disturbance, and anxiety: Secondary | ICD-10-CM | POA: Diagnosis not present

## 2024-10-30 DIAGNOSIS — Z23 Encounter for immunization: Secondary | ICD-10-CM | POA: Diagnosis not present

## 2024-10-30 DIAGNOSIS — I2510 Atherosclerotic heart disease of native coronary artery without angina pectoris: Secondary | ICD-10-CM | POA: Diagnosis not present

## 2024-10-30 DIAGNOSIS — E039 Hypothyroidism, unspecified: Secondary | ICD-10-CM | POA: Diagnosis not present

## 2024-10-30 DIAGNOSIS — E538 Deficiency of other specified B group vitamins: Secondary | ICD-10-CM | POA: Diagnosis not present

## 2024-10-30 DIAGNOSIS — I700 Atherosclerosis of aorta: Secondary | ICD-10-CM | POA: Diagnosis not present

## 2024-10-30 DIAGNOSIS — Z125 Encounter for screening for malignant neoplasm of prostate: Secondary | ICD-10-CM | POA: Diagnosis not present

## 2024-10-30 DIAGNOSIS — E7800 Pure hypercholesterolemia, unspecified: Secondary | ICD-10-CM | POA: Diagnosis not present

## 2024-10-30 DIAGNOSIS — D72819 Decreased white blood cell count, unspecified: Secondary | ICD-10-CM | POA: Diagnosis not present

## 2024-10-30 DIAGNOSIS — C8400 Mycosis fungoides, unspecified site: Secondary | ICD-10-CM | POA: Diagnosis not present

## 2024-10-30 DIAGNOSIS — Z0000 Encounter for general adult medical examination without abnormal findings: Secondary | ICD-10-CM | POA: Diagnosis not present

## 2024-10-30 DIAGNOSIS — R7303 Prediabetes: Secondary | ICD-10-CM | POA: Diagnosis not present

## 2024-10-31 DIAGNOSIS — C8400 Mycosis fungoides, unspecified site: Secondary | ICD-10-CM | POA: Diagnosis not present

## 2024-11-02 DIAGNOSIS — C8400 Mycosis fungoides, unspecified site: Secondary | ICD-10-CM | POA: Diagnosis not present

## 2024-12-06 ENCOUNTER — Ambulatory Visit
Admission: RE | Admit: 2024-12-06 | Discharge: 2024-12-06 | Disposition: A | Discharge status: Home / Self Care | Source: Ambulatory Visit | Attending: Internal Medicine | Admitting: Internal Medicine

## 2024-12-06 ENCOUNTER — Inpatient Hospital Stay: Admission: RE | Admit: 2024-12-06

## 2024-12-06 DIAGNOSIS — E041 Nontoxic single thyroid nodule: Secondary | ICD-10-CM
